# Patient Record
Sex: Male | Born: 1970 | Race: White | Hispanic: No | Marital: Single | Smoking: Former smoker
Health system: Southern US, Community
[De-identification: ages and names within clinical notes are randomized; demographics above are authoritative.]

## PROBLEM LIST (undated history)

## (undated) DIAGNOSIS — I509 Heart failure, unspecified: Secondary | ICD-10-CM

## (undated) DIAGNOSIS — F32A Depression, unspecified: Secondary | ICD-10-CM

## (undated) DIAGNOSIS — E119 Type 2 diabetes mellitus without complications: Secondary | ICD-10-CM

## (undated) DIAGNOSIS — D638 Anemia in other chronic diseases classified elsewhere: Secondary | ICD-10-CM

## (undated) DIAGNOSIS — E669 Obesity, unspecified: Secondary | ICD-10-CM

## (undated) DIAGNOSIS — I10 Essential (primary) hypertension: Secondary | ICD-10-CM

## (undated) DIAGNOSIS — F329 Major depressive disorder, single episode, unspecified: Secondary | ICD-10-CM

## (undated) DIAGNOSIS — E785 Hyperlipidemia, unspecified: Secondary | ICD-10-CM

## (undated) DIAGNOSIS — N189 Chronic kidney disease, unspecified: Secondary | ICD-10-CM

## (undated) HISTORY — PX: FOOT AMPUTATION: SHX951

## (undated) HISTORY — PX: OTHER SURGICAL HISTORY: SHX169

---
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
## 2019-12-06 ENCOUNTER — Emergency Department (HOSPITAL_COMMUNITY): Payer: Medicare PPO

## 2019-12-06 ENCOUNTER — Encounter (HOSPITAL_COMMUNITY): Payer: Self-pay | Admitting: Emergency Medicine

## 2019-12-06 ENCOUNTER — Inpatient Hospital Stay (HOSPITAL_COMMUNITY)
Admission: EM | Admit: 2019-12-06 | Discharge: 2019-12-16 | DRG: 239 | Disposition: A | Discharge status: Home Health | Payer: Medicare PPO | Attending: Internal Medicine | Admitting: Internal Medicine

## 2019-12-06 ENCOUNTER — Other Ambulatory Visit: Payer: Self-pay

## 2019-12-06 DIAGNOSIS — E11622 Type 2 diabetes mellitus with other skin ulcer: Secondary | ICD-10-CM

## 2019-12-06 DIAGNOSIS — R7989 Other specified abnormal findings of blood chemistry: Secondary | ICD-10-CM

## 2019-12-06 DIAGNOSIS — E871 Hypo-osmolality and hyponatremia: Secondary | ICD-10-CM | POA: Diagnosis present

## 2019-12-06 DIAGNOSIS — Z951 Presence of aortocoronary bypass graft: Secondary | ICD-10-CM

## 2019-12-06 DIAGNOSIS — D638 Anemia in other chronic diseases classified elsewhere: Secondary | ICD-10-CM | POA: Diagnosis present

## 2019-12-06 DIAGNOSIS — E785 Hyperlipidemia, unspecified: Secondary | ICD-10-CM | POA: Diagnosis present

## 2019-12-06 DIAGNOSIS — I10 Essential (primary) hypertension: Secondary | ICD-10-CM | POA: Diagnosis present

## 2019-12-06 DIAGNOSIS — R400 Somnolence: Secondary | ICD-10-CM

## 2019-12-06 DIAGNOSIS — N179 Acute kidney failure, unspecified: Secondary | ICD-10-CM | POA: Diagnosis present

## 2019-12-06 DIAGNOSIS — I5033 Acute on chronic diastolic (congestive) heart failure: Secondary | ICD-10-CM | POA: Diagnosis present

## 2019-12-06 DIAGNOSIS — R739 Hyperglycemia, unspecified: Secondary | ICD-10-CM

## 2019-12-06 DIAGNOSIS — Z87891 Personal history of nicotine dependence: Secondary | ICD-10-CM

## 2019-12-06 DIAGNOSIS — Z79899 Other long term (current) drug therapy: Secondary | ICD-10-CM

## 2019-12-06 DIAGNOSIS — Y92410 Unspecified street and highway as the place of occurrence of the external cause: Secondary | ICD-10-CM

## 2019-12-06 DIAGNOSIS — N1832 Chronic kidney disease, stage 3b: Secondary | ICD-10-CM | POA: Diagnosis not present

## 2019-12-06 DIAGNOSIS — Z7902 Long term (current) use of antithrombotics/antiplatelets: Secondary | ICD-10-CM | POA: Diagnosis not present

## 2019-12-06 DIAGNOSIS — L97909 Non-pressure chronic ulcer of unspecified part of unspecified lower leg with unspecified severity: Secondary | ICD-10-CM | POA: Diagnosis not present

## 2019-12-06 DIAGNOSIS — S82891B Other fracture of right lower leg, initial encounter for open fracture type I or II: Secondary | ICD-10-CM

## 2019-12-06 DIAGNOSIS — I509 Heart failure, unspecified: Secondary | ICD-10-CM | POA: Diagnosis not present

## 2019-12-06 DIAGNOSIS — Z20822 Contact with and (suspected) exposure to covid-19: Secondary | ICD-10-CM | POA: Diagnosis present

## 2019-12-06 DIAGNOSIS — D631 Anemia in chronic kidney disease: Secondary | ICD-10-CM | POA: Diagnosis present

## 2019-12-06 DIAGNOSIS — E1122 Type 2 diabetes mellitus with diabetic chronic kidney disease: Secondary | ICD-10-CM | POA: Diagnosis present

## 2019-12-06 DIAGNOSIS — G894 Chronic pain syndrome: Secondary | ICD-10-CM | POA: Diagnosis present

## 2019-12-06 DIAGNOSIS — I2510 Atherosclerotic heart disease of native coronary artery without angina pectoris: Secondary | ICD-10-CM | POA: Diagnosis present

## 2019-12-06 DIAGNOSIS — E869 Volume depletion, unspecified: Secondary | ICD-10-CM | POA: Diagnosis not present

## 2019-12-06 DIAGNOSIS — E1151 Type 2 diabetes mellitus with diabetic peripheral angiopathy without gangrene: Secondary | ICD-10-CM | POA: Diagnosis present

## 2019-12-06 DIAGNOSIS — S82891A Other fracture of right lower leg, initial encounter for closed fracture: Secondary | ICD-10-CM | POA: Diagnosis present

## 2019-12-06 DIAGNOSIS — R748 Abnormal levels of other serum enzymes: Secondary | ICD-10-CM | POA: Diagnosis present

## 2019-12-06 DIAGNOSIS — R410 Disorientation, unspecified: Secondary | ICD-10-CM

## 2019-12-06 DIAGNOSIS — E11621 Type 2 diabetes mellitus with foot ulcer: Secondary | ICD-10-CM | POA: Diagnosis present

## 2019-12-06 DIAGNOSIS — Z89512 Acquired absence of left leg below knee: Secondary | ICD-10-CM

## 2019-12-06 DIAGNOSIS — R791 Abnormal coagulation profile: Secondary | ICD-10-CM | POA: Diagnosis not present

## 2019-12-06 DIAGNOSIS — S9304XA Dislocation of right ankle joint, initial encounter: Secondary | ICD-10-CM | POA: Diagnosis present

## 2019-12-06 DIAGNOSIS — I959 Hypotension, unspecified: Secondary | ICD-10-CM

## 2019-12-06 DIAGNOSIS — L0889 Other specified local infections of the skin and subcutaneous tissue: Secondary | ICD-10-CM | POA: Diagnosis not present

## 2019-12-06 DIAGNOSIS — B952 Enterococcus as the cause of diseases classified elsewhere: Secondary | ICD-10-CM | POA: Diagnosis present

## 2019-12-06 DIAGNOSIS — N184 Chronic kidney disease, stage 4 (severe): Secondary | ICD-10-CM | POA: Diagnosis present

## 2019-12-06 DIAGNOSIS — E1110 Type 2 diabetes mellitus with ketoacidosis without coma: Secondary | ICD-10-CM | POA: Diagnosis present

## 2019-12-06 DIAGNOSIS — E11628 Type 2 diabetes mellitus with other skin complications: Secondary | ICD-10-CM | POA: Diagnosis present

## 2019-12-06 DIAGNOSIS — I998 Other disorder of circulatory system: Secondary | ICD-10-CM | POA: Diagnosis present

## 2019-12-06 DIAGNOSIS — L97519 Non-pressure chronic ulcer of other part of right foot with unspecified severity: Secondary | ICD-10-CM | POA: Diagnosis present

## 2019-12-06 DIAGNOSIS — Z9049 Acquired absence of other specified parts of digestive tract: Secondary | ICD-10-CM

## 2019-12-06 DIAGNOSIS — I130 Hypertensive heart and chronic kidney disease with heart failure and stage 1 through stage 4 chronic kidney disease, or unspecified chronic kidney disease: Secondary | ICD-10-CM | POA: Diagnosis present

## 2019-12-06 DIAGNOSIS — N189 Chronic kidney disease, unspecified: Secondary | ICD-10-CM | POA: Diagnosis present

## 2019-12-06 DIAGNOSIS — K5900 Constipation, unspecified: Secondary | ICD-10-CM | POA: Diagnosis not present

## 2019-12-06 DIAGNOSIS — T40605A Adverse effect of unspecified narcotics, initial encounter: Secondary | ICD-10-CM | POA: Diagnosis present

## 2019-12-06 DIAGNOSIS — I70229 Atherosclerosis of native arteries of extremities with rest pain, unspecified extremity: Secondary | ICD-10-CM

## 2019-12-06 DIAGNOSIS — I70221 Atherosclerosis of native arteries of extremities with rest pain, right leg: Secondary | ICD-10-CM | POA: Diagnosis present

## 2019-12-06 DIAGNOSIS — I5032 Chronic diastolic (congestive) heart failure: Secondary | ICD-10-CM | POA: Diagnosis not present

## 2019-12-06 DIAGNOSIS — E669 Obesity, unspecified: Secondary | ICD-10-CM | POA: Diagnosis present

## 2019-12-06 DIAGNOSIS — T4275XA Adverse effect of unspecified antiepileptic and sedative-hypnotic drugs, initial encounter: Secondary | ICD-10-CM | POA: Diagnosis present

## 2019-12-06 DIAGNOSIS — Z6835 Body mass index (BMI) 35.0-35.9, adult: Secondary | ICD-10-CM | POA: Diagnosis not present

## 2019-12-06 DIAGNOSIS — IMO0002 Reserved for concepts with insufficient information to code with codable children: Secondary | ICD-10-CM

## 2019-12-06 DIAGNOSIS — Z794 Long term (current) use of insulin: Secondary | ICD-10-CM

## 2019-12-06 DIAGNOSIS — L089 Local infection of the skin and subcutaneous tissue, unspecified: Secondary | ICD-10-CM | POA: Diagnosis present

## 2019-12-06 DIAGNOSIS — E1165 Type 2 diabetes mellitus with hyperglycemia: Secondary | ICD-10-CM | POA: Diagnosis not present

## 2019-12-06 DIAGNOSIS — Z23 Encounter for immunization: Secondary | ICD-10-CM

## 2019-12-06 DIAGNOSIS — E1142 Type 2 diabetes mellitus with diabetic polyneuropathy: Secondary | ICD-10-CM | POA: Diagnosis present

## 2019-12-06 DIAGNOSIS — G92 Toxic encephalopathy: Secondary | ICD-10-CM | POA: Diagnosis present

## 2019-12-06 DIAGNOSIS — R0602 Shortness of breath: Secondary | ICD-10-CM

## 2019-12-06 HISTORY — DX: Depression, unspecified: F32.A

## 2019-12-06 HISTORY — DX: Chronic kidney disease, unspecified: N18.9

## 2019-12-06 HISTORY — DX: Obesity, unspecified: E66.9

## 2019-12-06 HISTORY — DX: Type 2 diabetes mellitus without complications: E11.9

## 2019-12-06 HISTORY — DX: Hyperlipidemia, unspecified: E78.5

## 2019-12-06 HISTORY — DX: Heart failure, unspecified: I50.9

## 2019-12-06 HISTORY — DX: Essential (primary) hypertension: I10

## 2019-12-06 HISTORY — DX: Anemia in other chronic diseases classified elsewhere: D63.8

## 2019-12-06 LAB — COMPREHENSIVE METABOLIC PANEL
ALT: 286 U/L — ABNORMAL HIGH (ref 0–44)
AST: 52 U/L — ABNORMAL HIGH (ref 15–41)
Albumin: 2.5 g/dL — ABNORMAL LOW (ref 3.5–5.0)
Alkaline Phosphatase: 122 U/L (ref 38–126)
Anion gap: 18 — ABNORMAL HIGH (ref 5–15)
BUN: 87 mg/dL — ABNORMAL HIGH (ref 6–20)
CO2: 17 mmol/L — ABNORMAL LOW (ref 22–32)
Calcium: 6.5 mg/dL — ABNORMAL LOW (ref 8.9–10.3)
Chloride: 92 mmol/L — ABNORMAL LOW (ref 98–111)
Creatinine, Ser: 5.56 mg/dL — ABNORMAL HIGH (ref 0.61–1.24)
GFR calc Af Amer: 13 mL/min — ABNORMAL LOW (ref >60)
GFR calc non Af Amer: 11 mL/min — ABNORMAL LOW (ref >60)
Glucose, Bld: 605 mg/dL (ref 70–99)
Potassium: 4.5 mmol/L (ref 3.5–5.1)
Sodium: 127 mmol/L — ABNORMAL LOW (ref 135–145)
Total Bilirubin: 2.2 mg/dL — ABNORMAL HIGH (ref 0.3–1.2)
Total Protein: 6.8 g/dL (ref 6.5–8.1)

## 2019-12-06 LAB — BASIC METABOLIC PANEL
Anion gap: 13 (ref 5–15)
BUN: 85 mg/dL — ABNORMAL HIGH (ref 6–20)
CO2: 20 mmol/L — ABNORMAL LOW (ref 22–32)
Calcium: 6.5 mg/dL — ABNORMAL LOW (ref 8.9–10.3)
Chloride: 100 mmol/L (ref 98–111)
Creatinine, Ser: 5.11 mg/dL — ABNORMAL HIGH (ref 0.61–1.24)
GFR calc Af Amer: 14 mL/min — ABNORMAL LOW (ref >60)
GFR calc non Af Amer: 12 mL/min — ABNORMAL LOW (ref >60)
Glucose, Bld: 115 mg/dL — ABNORMAL HIGH (ref 70–99)
Potassium: 3.8 mmol/L (ref 3.5–5.1)
Sodium: 133 mmol/L — ABNORMAL LOW (ref 135–145)

## 2019-12-06 LAB — GLUCOSE, CAPILLARY
Glucose-Capillary: 106 mg/dL — ABNORMAL HIGH (ref 70–99)
Glucose-Capillary: 119 mg/dL — ABNORMAL HIGH (ref 70–99)
Glucose-Capillary: 138 mg/dL — ABNORMAL HIGH (ref 70–99)
Glucose-Capillary: 143 mg/dL — ABNORMAL HIGH (ref 70–99)
Glucose-Capillary: 162 mg/dL — ABNORMAL HIGH (ref 70–99)

## 2019-12-06 LAB — HEPATITIS PANEL, ACUTE
HCV Ab: NONREACTIVE
Hep A IgM: NONREACTIVE
Hep B C IgM: NONREACTIVE
Hepatitis B Surface Ag: NONREACTIVE

## 2019-12-06 LAB — CBC WITH DIFFERENTIAL/PLATELET
Abs Immature Granulocytes: 0.11 10*3/uL — ABNORMAL HIGH (ref 0.00–0.07)
Basophils Absolute: 0 10*3/uL (ref 0.0–0.1)
Basophils Relative: 0 %
Eosinophils Absolute: 0 10*3/uL (ref 0.0–0.5)
Eosinophils Relative: 0 %
HCT: 29 % — ABNORMAL LOW (ref 39.0–52.0)
Hemoglobin: 8.5 g/dL — ABNORMAL LOW (ref 13.0–17.0)
Immature Granulocytes: 1 %
Lymphocytes Relative: 15 %
Lymphs Abs: 1.3 10*3/uL (ref 0.7–4.0)
MCH: 24.6 pg — ABNORMAL LOW (ref 26.0–34.0)
MCHC: 29.3 g/dL — ABNORMAL LOW (ref 30.0–36.0)
MCV: 83.8 fL (ref 80.0–100.0)
Monocytes Absolute: 0.9 10*3/uL (ref 0.1–1.0)
Monocytes Relative: 11 %
Neutro Abs: 5.9 10*3/uL (ref 1.7–7.7)
Neutrophils Relative %: 73 %
Platelets: 225 10*3/uL (ref 150–400)
RBC: 3.46 MIL/uL — ABNORMAL LOW (ref 4.22–5.81)
RDW: 17.1 % — ABNORMAL HIGH (ref 11.5–15.5)
WBC: 8.2 10*3/uL (ref 4.0–10.5)
nRBC: 0 % (ref 0.0–0.2)

## 2019-12-06 LAB — URINALYSIS, ROUTINE W REFLEX MICROSCOPIC
Bilirubin Urine: NEGATIVE
Glucose, UA: 50 mg/dL — AB
Hgb urine dipstick: NEGATIVE
Ketones, ur: NEGATIVE mg/dL
Leukocytes,Ua: NEGATIVE
Nitrite: NEGATIVE
Protein, ur: 30 mg/dL — AB
Specific Gravity, Urine: 1.016 (ref 1.005–1.030)
pH: 5 (ref 5.0–8.0)

## 2019-12-06 LAB — POCT I-STAT EG7
Acid-base deficit: 6 mmol/L — ABNORMAL HIGH (ref 0.0–2.0)
Bicarbonate: 20.9 mmol/L (ref 20.0–28.0)
Calcium, Ion: 0.88 mmol/L — CL (ref 1.15–1.40)
HCT: 28 % — ABNORMAL LOW (ref 39.0–52.0)
Hemoglobin: 9.5 g/dL — ABNORMAL LOW (ref 13.0–17.0)
O2 Saturation: 79 %
Potassium: 4.4 mmol/L (ref 3.5–5.1)
Sodium: 131 mmol/L — ABNORMAL LOW (ref 135–145)
TCO2: 22 mmol/L (ref 22–32)
pCO2, Ven: 45.8 mmHg (ref 44.0–60.0)
pH, Ven: 7.268 (ref 7.250–7.430)
pO2, Ven: 50 mmHg — ABNORMAL HIGH (ref 32.0–45.0)

## 2019-12-06 LAB — CBG MONITORING, ED
Glucose-Capillary: 482 mg/dL — ABNORMAL HIGH (ref 70–99)
Glucose-Capillary: 498 mg/dL — ABNORMAL HIGH (ref 70–99)
Glucose-Capillary: 347 mg/dL — ABNORMAL HIGH (ref 70–99)
Glucose-Capillary: 461 mg/dL — ABNORMAL HIGH (ref 70–99)
Glucose-Capillary: 283 mg/dL — ABNORMAL HIGH (ref 70–99)
Glucose-Capillary: 219 mg/dL — ABNORMAL HIGH (ref 70–99)

## 2019-12-06 LAB — ETHANOL: Alcohol, Ethyl (B): <10 mg/dL (ref <10)

## 2019-12-06 LAB — RESPIRATORY PANEL BY RT PCR (FLU A&B, COVID)
Influenza A by PCR: NEGATIVE
Influenza B by PCR: NEGATIVE
SARS Coronavirus 2 by RT PCR: NEGATIVE

## 2019-12-06 LAB — APTT: aPTT: 36 seconds (ref 24–36)

## 2019-12-06 LAB — AMMONIA: Ammonia: 27 umol/L (ref 9–35)

## 2019-12-06 LAB — LACTIC ACID, PLASMA: Lactic Acid, Venous: 1.9 mmol/L (ref 0.5–1.9)

## 2019-12-06 LAB — IRON AND TIBC
Iron: 15 ug/dL — ABNORMAL LOW (ref 45–182)
Saturation Ratios: 8 % — ABNORMAL LOW (ref 17.9–39.5)
TIBC: 185 ug/dL — ABNORMAL LOW (ref 250–450)
UIBC: 170 ug/dL

## 2019-12-06 LAB — VITAMIN B12: Vitamin B-12: 2350 pg/mL — ABNORMAL HIGH (ref 180–914)

## 2019-12-06 LAB — PROTIME-INR
INR: 1.5 — ABNORMAL HIGH (ref 0.8–1.2)
Prothrombin Time: 18.4 seconds — ABNORMAL HIGH (ref 11.4–15.2)

## 2019-12-06 LAB — MAGNESIUM: Magnesium: 1.1 mg/dL — ABNORMAL LOW (ref 1.7–2.4)

## 2019-12-06 LAB — HEMOGLOBIN A1C
Hgb A1c MFr Bld: 7.8 % — ABNORMAL HIGH (ref 4.8–5.6)
Mean Plasma Glucose: 177.16 mg/dL

## 2019-12-06 LAB — LIPID PANEL
Cholesterol: 80 mg/dL (ref 0–200)
HDL: <10 mg/dL — ABNORMAL LOW (ref >40)
Triglycerides: 213 mg/dL — ABNORMAL HIGH (ref <150)
VLDL: 43 mg/dL — ABNORMAL HIGH (ref 0–40)

## 2019-12-06 LAB — RAPID URINE DRUG SCREEN, HOSP PERFORMED
Amphetamines: POSITIVE — AB
Barbiturates: NOT DETECTED
Benzodiazepines: NOT DETECTED
Cocaine: NOT DETECTED
Opiates: POSITIVE — AB
Tetrahydrocannabinol: NOT DETECTED

## 2019-12-06 LAB — FERRITIN: Ferritin: 3752 ng/mL — ABNORMAL HIGH (ref 24–336)

## 2019-12-06 LAB — PHOSPHORUS: Phosphorus: 5.3 mg/dL — ABNORMAL HIGH (ref 2.5–4.6)

## 2019-12-06 LAB — HIV ANTIBODY (ROUTINE TESTING W REFLEX): HIV Screen 4th Generation wRfx: NONREACTIVE

## 2019-12-06 MED ORDER — MAGNESIUM SULFATE 2 GM/50ML IV SOLN
2.0000 g | Freq: Once | INTRAVENOUS | Status: AC
  Administered 2019-12-06: 2 g via INTRAVENOUS
  Filled 2019-12-06: qty 50

## 2019-12-06 MED ORDER — TETANUS-DIPHTH-ACELL PERTUSSIS 5-2.5-18.5 LF-MCG/0.5 IM SUSP
0.5000 mL | Freq: Once | INTRAMUSCULAR | Status: AC
  Administered 2019-12-06: 0.5 mL via INTRAMUSCULAR
  Filled 2019-12-06: qty 0.5

## 2019-12-06 MED ORDER — ONDANSETRON HCL 4 MG/2ML IJ SOLN: 4.0000 mg | Freq: Four times a day (QID) | INTRAMUSCULAR | Status: DC | PRN

## 2019-12-06 MED ORDER — CEFAZOLIN SODIUM-DEXTROSE 2-4 GM/100ML-% IV SOLN
2.0000 g | Freq: Once | INTRAVENOUS | Status: AC
  Administered 2019-12-06: 2 g via INTRAVENOUS
  Filled 2019-12-06: qty 100

## 2019-12-06 MED ORDER — HEPARIN SODIUM (PORCINE) 5000 UNIT/ML IJ SOLN: 5000.0000 [IU] | Freq: Three times a day (TID) | INTRAMUSCULAR | Status: DC

## 2019-12-06 MED ORDER — METOPROLOL SUCCINATE ER 25 MG PO TB24
25.0000 mg | ORAL_TABLET | Freq: Two times a day (BID) | ORAL | Status: DC
  Administered 2019-12-06 – 2019-12-16 (×18): 25 mg via ORAL
  Filled 2019-12-06 (×19): qty 1

## 2019-12-06 MED ORDER — ISOSORBIDE MONONITRATE ER 30 MG PO TB24
30.0000 mg | ORAL_TABLET | Freq: Every day | ORAL | Status: DC
  Administered 2019-12-07 – 2019-12-16 (×9): 30 mg via ORAL
  Filled 2019-12-06 (×9): qty 1

## 2019-12-06 MED ORDER — INSULIN ASPART 100 UNIT/ML ~~LOC~~ SOLN: 6.0000 [IU] | Freq: Once | SUBCUTANEOUS | Status: DC

## 2019-12-06 MED ORDER — INSULIN ASPART 100 UNIT/ML ~~LOC~~ SOLN: 0.0000 [IU] | Freq: Three times a day (TID) | SUBCUTANEOUS | Status: DC

## 2019-12-06 MED ORDER — INSULIN REGULAR(HUMAN) IN NACL 100-0.9 UT/100ML-% IV SOLN
INTRAVENOUS | Status: DC
  Administered 2019-12-06: 14:00:00 11.5 [IU]/h via INTRAVENOUS
  Filled 2019-12-06: qty 100

## 2019-12-06 MED ORDER — TETANUS-DIPHTHERIA TOXOIDS TD 5-2 LFU IM INJ: 0.5000 mL | INJECTION | Freq: Once | INTRAMUSCULAR | Status: DC

## 2019-12-06 MED ORDER — INSULIN ASPART 100 UNIT/ML ~~LOC~~ SOLN: 0.0000 [IU] | Freq: Every day | SUBCUTANEOUS | Status: DC

## 2019-12-06 MED ORDER — AMPHETAMINE-DEXTROAMPHETAMINE 10 MG PO TABS
30.0000 mg | ORAL_TABLET | Freq: Two times a day (BID) | ORAL | Status: DC
  Administered 2019-12-07: 30 mg via ORAL
  Filled 2019-12-06: qty 3

## 2019-12-06 MED ORDER — SODIUM CHLORIDE 0.9 % IV BOLUS
1000.0000 mL | Freq: Once | INTRAVENOUS | Status: AC
  Administered 2019-12-06: 1000 mL via INTRAVENOUS

## 2019-12-06 MED ORDER — FENTANYL CITRATE (PF) 100 MCG/2ML IJ SOLN
12.5000 ug | INTRAMUSCULAR | Status: DC | PRN
  Administered 2019-12-06: 12.5 ug via INTRAVENOUS
  Filled 2019-12-06: qty 2

## 2019-12-06 MED ORDER — SODIUM CHLORIDE 0.9 % IV SOLN: INTRAVENOUS | Status: DC

## 2019-12-06 MED ORDER — QUETIAPINE FUMARATE ER 50 MG PO TB24
150.0000 mg | ORAL_TABLET | Freq: Every day | ORAL | Status: DC
  Administered 2019-12-06: 22:00:00 150 mg via ORAL
  Filled 2019-12-06 (×3): qty 3

## 2019-12-06 MED ORDER — ONDANSETRON HCL 4 MG PO TABS: 4.0000 mg | ORAL_TABLET | Freq: Four times a day (QID) | ORAL | Status: DC | PRN

## 2019-12-06 MED ORDER — AMLODIPINE BESYLATE 10 MG PO TABS
10.0000 mg | ORAL_TABLET | Freq: Every day | ORAL | Status: DC
  Administered 2019-12-07: 10 mg via ORAL
  Filled 2019-12-06: qty 1

## 2019-12-06 MED ORDER — ACETAMINOPHEN 325 MG PO TABS
650.0000 mg | ORAL_TABLET | Freq: Four times a day (QID) | ORAL | Status: DC | PRN
  Administered 2019-12-07 – 2019-12-08 (×2): 650 mg via ORAL
  Filled 2019-12-06 (×3): qty 2

## 2019-12-06 MED ORDER — ATORVASTATIN CALCIUM 10 MG PO TABS
10.0000 mg | ORAL_TABLET | Freq: Every day | ORAL | Status: DC
  Administered 2019-12-06: 10 mg via ORAL
  Filled 2019-12-06: qty 1

## 2019-12-06 MED ORDER — PREGABALIN 100 MG PO CAPS: 200.0000 mg | ORAL_CAPSULE | Freq: Two times a day (BID) | ORAL | Status: DC

## 2019-12-06 MED ORDER — DEXTROSE-NACL 5-0.45 % IV SOLN: INTRAVENOUS | Status: DC

## 2019-12-06 MED ORDER — INSULIN GLARGINE 100 UNIT/ML ~~LOC~~ SOLN
10.0000 [IU] | Freq: Every day | SUBCUTANEOUS | Status: DC
  Administered 2019-12-07 – 2019-12-09 (×4): 10 [IU] via SUBCUTANEOUS
  Filled 2019-12-06 (×6): qty 0.1

## 2019-12-06 MED ORDER — ACETAMINOPHEN 650 MG RE SUPP: 650.0000 mg | Freq: Four times a day (QID) | RECTAL | Status: DC | PRN

## 2019-12-06 MED ORDER — DEXTROSE 50 % IV SOLN: 0.0000 mL | INTRAVENOUS | Status: DC | PRN

## 2019-12-06 MED ORDER — CLOPIDOGREL BISULFATE 75 MG PO TABS
75.0000 mg | ORAL_TABLET | Freq: Every day | ORAL | Status: DC
  Administered 2019-12-07 – 2019-12-16 (×9): 75 mg via ORAL
  Filled 2019-12-06 (×9): qty 1
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
## 2019-12-06 NOTE — ED Notes (Signed)
Patient transported to CT 
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
## 2019-12-06 NOTE — Progress Notes (Signed)
Patient received from ED. VSS stable. CHG bath complete. On monitor CCMD notified. Alert and oriented to room and call light.  Paulene Floor, RN

## 2019-12-07 ENCOUNTER — Other Ambulatory Visit (HOSPITAL_COMMUNITY): Payer: Medicare PPO

## 2019-12-07 ENCOUNTER — Inpatient Hospital Stay (HOSPITAL_COMMUNITY): Payer: Medicare PPO

## 2019-12-07 DIAGNOSIS — E785 Hyperlipidemia, unspecified: Secondary | ICD-10-CM

## 2019-12-07 DIAGNOSIS — E1110 Type 2 diabetes mellitus with ketoacidosis without coma: Secondary | ICD-10-CM

## 2019-12-07 DIAGNOSIS — R748 Abnormal levels of other serum enzymes: Secondary | ICD-10-CM

## 2019-12-07 DIAGNOSIS — N179 Acute kidney failure, unspecified: Secondary | ICD-10-CM

## 2019-12-07 DIAGNOSIS — R7989 Other specified abnormal findings of blood chemistry: Secondary | ICD-10-CM

## 2019-12-07 DIAGNOSIS — R400 Somnolence: Secondary | ICD-10-CM

## 2019-12-07 DIAGNOSIS — I959 Hypotension, unspecified: Secondary | ICD-10-CM

## 2019-12-07 DIAGNOSIS — R791 Abnormal coagulation profile: Secondary | ICD-10-CM | POA: Diagnosis present

## 2019-12-07 DIAGNOSIS — S82891B Other fracture of right lower leg, initial encounter for open fracture type I or II: Secondary | ICD-10-CM

## 2019-12-07 DIAGNOSIS — D638 Anemia in other chronic diseases classified elsewhere: Secondary | ICD-10-CM

## 2019-12-07 DIAGNOSIS — R410 Disorientation, unspecified: Secondary | ICD-10-CM

## 2019-12-07 DIAGNOSIS — I2510 Atherosclerotic heart disease of native coronary artery without angina pectoris: Secondary | ICD-10-CM

## 2019-12-07 DIAGNOSIS — I10 Essential (primary) hypertension: Secondary | ICD-10-CM

## 2019-12-07 DIAGNOSIS — L089 Local infection of the skin and subcutaneous tissue, unspecified: Secondary | ICD-10-CM

## 2019-12-07 DIAGNOSIS — Z6835 Body mass index (BMI) 35.0-35.9, adult: Secondary | ICD-10-CM

## 2019-12-07 DIAGNOSIS — N1832 Chronic kidney disease, stage 3b: Secondary | ICD-10-CM

## 2019-12-07 LAB — COMPREHENSIVE METABOLIC PANEL
ALT: 146 U/L — ABNORMAL HIGH (ref 0–44)
ALT: 81 U/L — ABNORMAL HIGH (ref 0–44)
AST: 34 U/L (ref 15–41)
AST: 29 U/L (ref 15–41)
Albumin: 2.1 g/dL — ABNORMAL LOW (ref 3.5–5.0)
Albumin: 2 g/dL — ABNORMAL LOW (ref 3.5–5.0)
Alkaline Phosphatase: 92 U/L (ref 38–126)
Alkaline Phosphatase: 91 U/L (ref 38–126)
Anion gap: 17 — ABNORMAL HIGH (ref 5–15)
Anion gap: 15 (ref 5–15)
BUN: 87 mg/dL — ABNORMAL HIGH (ref 6–20)
BUN: 91 mg/dL — ABNORMAL HIGH (ref 6–20)
CO2: 16 mmol/L — ABNORMAL LOW (ref 22–32)
CO2: 17 mmol/L — ABNORMAL LOW (ref 22–32)
Calcium: 6.3 mg/dL — CL (ref 8.9–10.3)
Calcium: 7.1 mg/dL — ABNORMAL LOW (ref 8.9–10.3)
Chloride: 101 mmol/L (ref 98–111)
Chloride: 103 mmol/L (ref 98–111)
Creatinine, Ser: 5.36 mg/dL — ABNORMAL HIGH (ref 0.61–1.24)
Creatinine, Ser: 5.35 mg/dL — ABNORMAL HIGH (ref 0.61–1.24)
GFR calc Af Amer: 13 mL/min — ABNORMAL LOW (ref >60)
GFR calc Af Amer: 13 mL/min — ABNORMAL LOW (ref >60)
GFR calc non Af Amer: 11 mL/min — ABNORMAL LOW (ref >60)
GFR calc non Af Amer: 12 mL/min — ABNORMAL LOW (ref >60)
Glucose, Bld: 139 mg/dL — ABNORMAL HIGH (ref 70–99)
Glucose, Bld: 151 mg/dL — ABNORMAL HIGH (ref 70–99)
Potassium: 4.2 mmol/L (ref 3.5–5.1)
Potassium: 4 mmol/L (ref 3.5–5.1)
Sodium: 134 mmol/L — ABNORMAL LOW (ref 135–145)
Sodium: 135 mmol/L (ref 135–145)
Total Bilirubin: 1.4 mg/dL — ABNORMAL HIGH (ref 0.3–1.2)
Total Bilirubin: 1.4 mg/dL — ABNORMAL HIGH (ref 0.3–1.2)
Total Protein: 6 g/dL — ABNORMAL LOW (ref 6.5–8.1)
Total Protein: 5.8 g/dL — ABNORMAL LOW (ref 6.5–8.1)

## 2019-12-07 LAB — CBC
HCT: 25.6 % — ABNORMAL LOW (ref 39.0–52.0)
Hemoglobin: 7.6 g/dL — ABNORMAL LOW (ref 13.0–17.0)
MCH: 25.2 pg — ABNORMAL LOW (ref 26.0–34.0)
MCHC: 29.7 g/dL — ABNORMAL LOW (ref 30.0–36.0)
MCV: 85 fL (ref 80.0–100.0)
Platelets: 156 10*3/uL (ref 150–400)
RBC: 3.01 MIL/uL — ABNORMAL LOW (ref 4.22–5.81)
RDW: 17.1 % — ABNORMAL HIGH (ref 11.5–15.5)
WBC: 5.7 10*3/uL (ref 4.0–10.5)
nRBC: 0 % (ref 0.0–0.2)

## 2019-12-07 LAB — MAGNESIUM
Magnesium: 1.2 mg/dL — ABNORMAL LOW (ref 1.7–2.4)
Magnesium: 2.3 mg/dL (ref 1.7–2.4)

## 2019-12-07 LAB — GLUCOSE, CAPILLARY
Glucose-Capillary: 134 mg/dL — ABNORMAL HIGH (ref 70–99)
Glucose-Capillary: 140 mg/dL — ABNORMAL HIGH (ref 70–99)
Glucose-Capillary: 141 mg/dL — ABNORMAL HIGH (ref 70–99)
Glucose-Capillary: 148 mg/dL — ABNORMAL HIGH (ref 70–99)
Glucose-Capillary: 156 mg/dL — ABNORMAL HIGH (ref 70–99)
Glucose-Capillary: 191 mg/dL — ABNORMAL HIGH (ref 70–99)

## 2019-12-07 LAB — SURGICAL PCR SCREEN
MRSA, PCR: NEGATIVE
Staphylococcus aureus: NEGATIVE

## 2019-12-07 MED ORDER — FUROSEMIDE 10 MG/ML IJ SOLN
40.0000 mg | Freq: Once | INTRAMUSCULAR | Status: AC
  Administered 2019-12-07: 40 mg via INTRAVENOUS
  Filled 2019-12-07: qty 4

## 2019-12-07 MED ORDER — MAGNESIUM SULFATE 4 GM/100ML IV SOLN
4.0000 g | Freq: Once | INTRAVENOUS | Status: AC
  Administered 2019-12-07: 4 g via INTRAVENOUS
  Filled 2019-12-07: qty 100

## 2019-12-07 MED ORDER — DARBEPOETIN ALFA 150 MCG/0.3ML IJ SOSY
150.0000 ug | PREFILLED_SYRINGE | INTRAMUSCULAR | Status: DC
  Administered 2019-12-07: 150 ug via SUBCUTANEOUS
  Filled 2019-12-07 (×3): qty 0.3

## 2019-12-07 MED ORDER — INSULIN ASPART 100 UNIT/ML ~~LOC~~ SOLN
0.0000 [IU] | SUBCUTANEOUS | Status: DC
  Administered 2019-12-07: 1 [IU] via SUBCUTANEOUS
  Administered 2019-12-08: 2 [IU] via SUBCUTANEOUS
  Administered 2019-12-08: 5 [IU] via SUBCUTANEOUS
  Administered 2019-12-08: 3 [IU] via SUBCUTANEOUS
  Administered 2019-12-08: 2 [IU] via SUBCUTANEOUS
  Administered 2019-12-09 (×3): 5 [IU] via SUBCUTANEOUS
  Administered 2019-12-09: 3 [IU] via SUBCUTANEOUS
  Administered 2019-12-10: 5 [IU] via SUBCUTANEOUS
  Administered 2019-12-10: 7 [IU] via SUBCUTANEOUS
  Administered 2019-12-10: 5 [IU] via SUBCUTANEOUS
  Administered 2019-12-10: 3 [IU] via SUBCUTANEOUS

## 2019-12-07 MED ORDER — OXYCODONE HCL 5 MG PO TABS
5.0000 mg | ORAL_TABLET | Freq: Once | ORAL | Status: AC
  Administered 2019-12-07: 5 mg via ORAL
  Filled 2019-12-07: qty 1

## 2019-12-07 MED ORDER — CALCIUM GLUCONATE-NACL 2-0.675 GM/100ML-% IV SOLN
2.0000 g | Freq: Once | INTRAVENOUS | Status: AC
  Administered 2019-12-07: 2000 mg via INTRAVENOUS
  Filled 2019-12-07: qty 100

## 2019-12-07 NOTE — Progress Notes (Addendum)
Attempted to get patient for ABI. Patient is refusing test at this time. Please contact vascular lab if patient becomes agreeable to test.  June Leap, BS, RDMS, RVT
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
## 2019-12-07 NOTE — Progress Notes (Signed)
CRITICAL VALUE ALERT  Critical Value:  Calcium 6.3  Date & Time Notied:  12/07/19 at 03:36 am  Provider Notified: Forrest Moron, NP  Orders Received/Actions taken: One time IV magnesium sulfate 4 Grams

## 2019-12-07 NOTE — Progress Notes (Signed)
Called [place] to confirm ab [place]. On call person assisting and may take a couple more hours but they are aware. Unable to contact vascular lab. Will continue to work with hospital operator. Patient initially refused ABI but is now wanting as soon as possible. Pt resting with call bell within reach.  Will continue to monitor.

## 2019-12-08 ENCOUNTER — Inpatient Hospital Stay (HOSPITAL_COMMUNITY): Payer: Medicare PPO

## 2019-12-08 DIAGNOSIS — L0889 Other specified local infections of the skin and subcutaneous tissue: Secondary | ICD-10-CM

## 2019-12-08 DIAGNOSIS — I509 Heart failure, unspecified: Secondary | ICD-10-CM

## 2019-12-08 DIAGNOSIS — N179 Acute kidney failure, unspecified: Secondary | ICD-10-CM

## 2019-12-08 DIAGNOSIS — R739 Hyperglycemia, unspecified: Secondary | ICD-10-CM

## 2019-12-08 DIAGNOSIS — I5032 Chronic diastolic (congestive) heart failure: Secondary | ICD-10-CM

## 2019-12-08 DIAGNOSIS — S82891A Other fracture of right lower leg, initial encounter for closed fracture: Secondary | ICD-10-CM

## 2019-12-08 LAB — RENAL FUNCTION PANEL
Albumin: 1.9 g/dL — ABNORMAL LOW (ref 3.5–5.0)
Anion gap: 16 — ABNORMAL HIGH (ref 5–15)
BUN: 91 mg/dL — ABNORMAL HIGH (ref 6–20)
CO2: 17 mmol/L — ABNORMAL LOW (ref 22–32)
Calcium: 7 mg/dL — ABNORMAL LOW (ref 8.9–10.3)
Chloride: 105 mmol/L (ref 98–111)
Creatinine, Ser: 5.08 mg/dL — ABNORMAL HIGH (ref 0.61–1.24)
GFR calc Af Amer: 14 mL/min — ABNORMAL LOW (ref >60)
GFR calc non Af Amer: 12 mL/min — ABNORMAL LOW (ref >60)
Glucose, Bld: 152 mg/dL — ABNORMAL HIGH (ref 70–99)
Phosphorus: 4.7 mg/dL — ABNORMAL HIGH (ref 2.5–4.6)
Potassium: 4.3 mmol/L (ref 3.5–5.1)
Sodium: 138 mmol/L (ref 135–145)

## 2019-12-08 LAB — BASIC METABOLIC PANEL
Anion gap: 12 (ref 5–15)
BUN: 93 mg/dL — ABNORMAL HIGH (ref 6–20)
CO2: 18 mmol/L — ABNORMAL LOW (ref 22–32)
Calcium: 7.8 mg/dL — ABNORMAL LOW (ref 8.9–10.3)
Chloride: 104 mmol/L (ref 98–111)
Creatinine, Ser: 4.92 mg/dL — ABNORMAL HIGH (ref 0.61–1.24)
GFR calc Af Amer: 15 mL/min — ABNORMAL LOW (ref >60)
GFR calc non Af Amer: 13 mL/min — ABNORMAL LOW (ref >60)
Glucose, Bld: 279 mg/dL — ABNORMAL HIGH (ref 70–99)
Potassium: 4.3 mmol/L (ref 3.5–5.1)
Sodium: 134 mmol/L — ABNORMAL LOW (ref 135–145)

## 2019-12-08 LAB — CBC WITH DIFFERENTIAL/PLATELET
Abs Immature Granulocytes: 0.1 10*3/uL — ABNORMAL HIGH (ref 0.00–0.07)
Basophils Absolute: 0 10*3/uL (ref 0.0–0.1)
Basophils Relative: 0 %
Eosinophils Absolute: 0 10*3/uL (ref 0.0–0.5)
Eosinophils Relative: 0 %
HCT: 25.1 % — ABNORMAL LOW (ref 39.0–52.0)
Hemoglobin: 7.6 g/dL — ABNORMAL LOW (ref 13.0–17.0)
Immature Granulocytes: 1 %
Lymphocytes Relative: 10 %
Lymphs Abs: 1.1 10*3/uL (ref 0.7–4.0)
MCH: 24.9 pg — ABNORMAL LOW (ref 26.0–34.0)
MCHC: 30.3 g/dL (ref 30.0–36.0)
MCV: 82.3 fL (ref 80.0–100.0)
Monocytes Absolute: 0.7 10*3/uL (ref 0.1–1.0)
Monocytes Relative: 7 %
Neutro Abs: 8.3 10*3/uL — ABNORMAL HIGH (ref 1.7–7.7)
Neutrophils Relative %: 82 %
Platelets: 192 10*3/uL (ref 150–400)
RBC: 3.05 MIL/uL — ABNORMAL LOW (ref 4.22–5.81)
RDW: 17.4 % — ABNORMAL HIGH (ref 11.5–15.5)
WBC: 10.2 10*3/uL (ref 4.0–10.5)
nRBC: 0 % (ref 0.0–0.2)

## 2019-12-08 LAB — PTH, INTACT AND CALCIUM
Calcium, Total (PTH): 6.8 mg/dL (ref 8.7–10.2)
PTH: 62 pg/mL (ref 15–65)

## 2019-12-08 LAB — FERRITIN: Ferritin: 1180 ng/mL — ABNORMAL HIGH (ref 24–336)

## 2019-12-08 LAB — PREALBUMIN: Prealbumin: 5.4 mg/dL — ABNORMAL LOW (ref 18–38)

## 2019-12-08 LAB — PROTIME-INR
INR: 1.4 — ABNORMAL HIGH (ref 0.8–1.2)
Prothrombin Time: 17.1 seconds — ABNORMAL HIGH (ref 11.4–15.2)

## 2019-12-08 LAB — IRON AND TIBC
Iron: 8 ug/dL — ABNORMAL LOW (ref 45–182)
Saturation Ratios: 5 % — ABNORMAL LOW (ref 17.9–39.5)
TIBC: 151 ug/dL — ABNORMAL LOW (ref 250–450)
UIBC: 143 ug/dL

## 2019-12-08 LAB — GLUCOSE, CAPILLARY
Glucose-Capillary: 151 mg/dL — ABNORMAL HIGH (ref 70–99)
Glucose-Capillary: 163 mg/dL — ABNORMAL HIGH (ref 70–99)
Glucose-Capillary: 173 mg/dL — ABNORMAL HIGH (ref 70–99)
Glucose-Capillary: 182 mg/dL — ABNORMAL HIGH (ref 70–99)
Glucose-Capillary: 222 mg/dL — ABNORMAL HIGH (ref 70–99)
Glucose-Capillary: 255 mg/dL — ABNORMAL HIGH (ref 70–99)

## 2019-12-08 LAB — C-REACTIVE PROTEIN: CRP: 17 mg/dL — ABNORMAL HIGH (ref <1.0)

## 2019-12-08 LAB — CBC
HCT: 26.5 % — ABNORMAL LOW (ref 39.0–52.0)
Hemoglobin: 8 g/dL — ABNORMAL LOW (ref 13.0–17.0)
MCH: 24.8 pg — ABNORMAL LOW (ref 26.0–34.0)
MCHC: 30.2 g/dL (ref 30.0–36.0)
MCV: 82 fL (ref 80.0–100.0)
Platelets: 170 10*3/uL (ref 150–400)
RBC: 3.23 MIL/uL — ABNORMAL LOW (ref 4.22–5.81)
RDW: 17.2 % — ABNORMAL HIGH (ref 11.5–15.5)
WBC: 6.8 10*3/uL (ref 4.0–10.5)
nRBC: 0 % (ref 0.0–0.2)

## 2019-12-08 LAB — SEDIMENTATION RATE: Sed Rate: 100 mm/hr — ABNORMAL HIGH (ref 0–16)

## 2019-12-08 MED ORDER — SODIUM CHLORIDE 0.9 % IV SOLN
2.0000 g | INTRAVENOUS | Status: DC
  Administered 2019-12-08 – 2019-12-10 (×3): 2 g via INTRAVENOUS
  Filled 2019-12-08 (×3): qty 2

## 2019-12-08 MED ORDER — METRONIDAZOLE IN NACL 5-0.79 MG/ML-% IV SOLN
500.0000 mg | Freq: Three times a day (TID) | INTRAVENOUS | Status: DC
  Administered 2019-12-08 – 2019-12-10 (×7): 500 mg via INTRAVENOUS
  Filled 2019-12-08 (×6): qty 100

## 2019-12-08 MED ORDER — PROSIGHT PO TABS
1.0000 | ORAL_TABLET | Freq: Every day | ORAL | Status: DC
  Administered 2019-12-10 – 2019-12-12 (×3): 1 via ORAL
  Filled 2019-12-08 (×3): qty 1

## 2019-12-08 MED ORDER — SODIUM CHLORIDE 0.9 % IV SOLN
3.0000 g | Freq: Once | INTRAVENOUS | Status: AC
  Administered 2019-12-08: 10:00:00 3 g via INTRAVENOUS
  Filled 2019-12-08: qty 30

## 2019-12-08 MED ORDER — ENSURE ENLIVE PO LIQD
237.0000 mL | Freq: Two times a day (BID) | ORAL | Status: DC
  Administered 2019-12-09 – 2019-12-16 (×10): 237 mL via ORAL

## 2019-12-08 MED ORDER — FUROSEMIDE 40 MG PO TABS
40.0000 mg | ORAL_TABLET | Freq: Two times a day (BID) | ORAL | Status: DC
  Administered 2019-12-08 – 2019-12-14 (×11): 40 mg via ORAL
  Filled 2019-12-08 (×9): qty 1

## 2019-12-08 MED ORDER — SODIUM CHLORIDE 0.9 % IV SOLN
510.0000 mg | Freq: Once | INTRAVENOUS | Status: AC
  Administered 2019-12-08: 18:00:00 510 mg via INTRAVENOUS
  Filled 2019-12-08: qty 17

## 2019-12-08 MED ORDER — JUVEN PO PACK
1.0000 | PACK | Freq: Two times a day (BID) | ORAL | Status: DC
  Administered 2019-12-09 – 2019-12-16 (×15): 1 via ORAL
  Filled 2019-12-08 (×17): qty 1

## 2019-12-08 MED ORDER — CEFAZOLIN SODIUM-DEXTROSE 1-4 GM/50ML-% IV SOLN: 1.0000 g | INTRAVENOUS | Status: DC

## 2019-12-08 MED ORDER — OCUVITE-LUTEIN PO CAPS
1.0000 | ORAL_CAPSULE | Freq: Every day | ORAL | Status: DC
  Filled 2019-12-08: qty 1

## 2019-12-08 NOTE — Progress Notes (Signed)
Parathyroid level Calcium Total (PTH) circuital 6.8, Text paged M.D.

## 2019-12-08 NOTE — Progress Notes (Signed)
Patient refuses to wear oxygen.
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
## 2019-12-08 NOTE — Progress Notes (Signed)
Inpatient Diabetes Program Recommendations  AACE/ADA: New Consensus Statement on Inpatient Glycemic Control   Target Ranges:  Prepandial:   less than 140 mg/dL      Peak postprandial:   less than 180 mg/dL (1-2 hours)      Critically ill patients:  140 - 180 mg/dL   Results for Ernest [name], Ernest [name] (MRN [id number]) as of 12/08/2019 16:47  Ref. Range 12/08/2019 00:10 12/08/2019 04:21 12/08/2019 08:11 12/08/2019 11:13 12/08/2019 16:29  Glucose-Capillary Latest Ref Range: 70 - 99 mg/dL 173 (H) 151 (H) 163 (H) 182 (H) 255 (H)  Results for Ernest [name], Ernest [name] (MRN [id number]) as of 12/08/2019 17:05  Ref. Range 12/06/2019 13:58  Hemoglobin A1C Latest Ref Range: 4.8 - 5.6 % 7.8 (H)   Review of Glycemic Control  Diabetes history: DM2 Outpatient Diabetes medications: Novolog 30 units TID with meals, Tradjenat 5 mg dailly, Lantus 40 units QHS (Lantus not on home med list but noted on PCP note on 11/13/19) Current orders for Inpatient glycemic control: Lantus 10 units QHS, Novolog 0-9 units Q4H  Inpatient Diabetes Program Recommendations:   Insulin-Correction: In reviewing chart, noted patient has NOT received any Novolog today (not charted against at all on [facility]).   NOTE: Noted consult for Diabetes Coordinator. Diabetes Coordinator is not on campus over the weekend but available by pager from 8am to 5pm for questions or concerns. Chart reviewed. Noted patient has DM2 hx and per home medication list patient takes Novolog and [place] as an outpatient. Noted in [place], patient seen PCP on 11/13/19 and per outpatient DM medications patient is also prescribed Lantus 40 units QHS (in addition to Novolog and Tradjenta) and A1C was 7.3% at that time). Patient received Lantus last night and fasting glucose 163 mg/dl today. However, Novolog insulin has not been given today per [facility].  RNs please administer insulin as ordered and document on MAR. Sent secure chat to Jeneen Rinks, RN to inquire about insulin not being given  today. Agree with current orders and would not recommend any changes with inpatient DM orders at this time.  Thanks, Barnie Alderman, RN, MSN, CDE Diabetes Coordinator Inpatient Diabetes Program [phone] (Team Pager from 8am to 5pm)

## 2019-12-08 NOTE — Progress Notes (Signed)
Patient told me he had Percocet in his coat and wanted me to hand it to him. I told patient I could not do that and I would call and get him something stronger for pain. I also informed him of Hospital policy about not having meds in his room and they needed to be sent down to Pharm. He told I could not go through his belongings and get it.

## 2019-12-08 NOTE — Progress Notes (Addendum)
RN paged NP due to pt being newly confused tonight, T 102, and RR 29-32. NP to bedside.  S: Says he is SOB "a little bit". Denies pain anywhere. Per RN, pt stated he had to void. Bladder scan 142.  O: Chronically ill appearing male in NAD. T 102. HR 115. BP 130s. RR 24-28. O2 sat 98%. He is alert. Oriented only to year. States he is in his mother's backyard. He is cooperative. Card: S1S2 with tachycardia. Resp: Lungs CTA. He is breathing slightly fast, but is oxygenating well. No real use of accessory muscles, and his abdomen is obese. Abd: soft, generalized tenderness but not specific. MOE x 4. RLE stump. LLE dressed due to infection. Neuro: non focal. PERRL. Strength to face and UEs is 5/5. Tongue is midline. No facial droop. No pronator drift. Skin is warm and dry.  A/P: 1. Confusion new in past hour or so, but not present earlier. Review of chart shows he was oriented today on rounds and disoriented on admission. CT head on admission showed age advanced cortical atrophy. Perhaps, he may have underlying dementia and he sundowns at night? D/c Seroquel for tonight (holding sedation). Labs show CBG 222, ammonia-43; CMP, CBC with diff-unremarkable. LA normal  This could all be secondary to temperature.  2. Tachypnea-believe secondary to fever. He is not really working hard to breathe. NP wonders if related to fever and/or abdominal girth. Satting fine. Was going to place 2L for comfort, but pt refused.  3. AKI on CKD-creat slightly down today, but this could be contributing to confusion. Nephrology following.  Will f/up after labs result.  4. Mable Fill has a known right ankle infection and is facing an amputation. He is on abx. Nephrology has him on Lasix now, so do not want to hydrate.  KJKG, NP [facility] Update: See labs above. Recheck ammonia in am and if trending upwards, will treat. Went back to see pt. Resting in bed. No respiratory distress. O2 sat normal. Temp down to 100.2.  KJKG, NP [facility]
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
## 2019-12-08 NOTE — Progress Notes (Signed)
Lab called with a low total calcium of 6.8 informed docotor

## 2019-12-08 NOTE — Progress Notes (Signed)
Temp Rectal 102. Forrest Moron N.P. and Erin Hearing. with rapid resp. Notified. David into see patient . Noe Gens N.P. on her way to come see patient. Patient is confused and seeing yellow cat in room and yellow door. He thinks he is in his Mother's backyard. Was not confused earlier. Skin warm and dry resp lab. Using Abdo. Muscles. Resp 26-30 sats. mid 90's -100 . H.R. [address] S. T. See vital sign flow sheet for vitals." I have to pee." patient stated . He is unable to bladder scan 172 ml.

## 2019-12-08 NOTE — Progress Notes (Signed)
MEDICATION RELATED CONSULT NOTE - INITIAL   Pharmacy Consult for Calcium Administration Indication: Hypocalcemia  No Known Allergies  Patient Measurements: Height: 5\' 9"  (175.3 cm) Weight: 229 lb 4.5 oz (104 kg) IBW/kg (Calculated) : 70.7  Vital Signs: Temp: 98.4 F (36.9 C) (12/26 0812) Temp Source: Oral (12/26 0812) BP: 116/69 (12/26 0812) Pulse Rate: 102 (12/26 0812) Intake/Output from previous day: 12/25 0701 - 12/26 0700 In: 360 [P.O.:360] Out: 905 [Urine:905] Intake/Output from this shift: No intake/output data recorded.  Labs: Recent Labs    12/06/19 0933 12/06/19 1221 12/06/19 1358 12/07/19 0238 12/07/19 1635 12/08/19 0326  WBC 8.2  --   --  5.7  --   --   HGB 8.5* 9.5*  --  7.6*  --   --   HCT 29.0* 28.0*  --  25.6*  --   --   PLT 225  --   --  156  --   --   APTT  --   --  36  --   --   --   CREATININE 5.56*  --   --  5.36* 5.35* 5.08*  MG  --   --  1.1* 1.2* 2.3  --   PHOS  --   --  5.3*  --   --  4.7*  ALBUMIN 2.5*  --   --  2.1* 2.0* 1.9*  PROT 6.8  --   --  6.0* 5.8*  --   AST 52*  --   --  34 29  --   ALT 286*  --   --  146* 81*  --   ALKPHOS 122  --   --  92 91  --   BILITOT 2.2*  --   --  1.4* 1.4*  --    Estimated Creatinine Clearance: 20.7 mL/min (A) (by C-G formula based on SCr of 5.08 mg/dL (H)).   Microbiology: Recent Results (from the past 720 hour(s))  Respiratory Panel by RT PCR (Flu A&B, Covid) - Nasopharyngeal Swab     Status: None   Collection Time: 12/06/19  9:33 AM   Specimen: Nasopharyngeal Swab  Result Value Ref Range Status   SARS Coronavirus 2 by RT PCR NEGATIVE NEGATIVE Final    Comment: (NOTE) SARS-CoV-2 target nucleic acids are NOT DETECTED. The SARS-CoV-2 RNA is generally detectable in upper respiratoy specimens during the acute phase of infection. The lowest concentration of SARS-CoV-2 viral copies this assay can detect is 131 copies/mL. A negative result does not preclude SARS-Cov-2 infection and should not be  used as the sole basis for treatment or other patient management decisions. A negative result may occur with  improper specimen collection/handling, submission of specimen other than nasopharyngeal swab, presence of viral mutation(s) within the areas targeted by this assay, and inadequate number of viral copies (<131 copies/mL). A negative result must be combined with clinical observations, patient history, and epidemiological information. The expected result is Negative. Fact Sheet for Patients:  PinkCheek.be Fact Sheet for Healthcare Providers:  GravelBags.it This test is not yet ap proved or cleared by the [place] FDA and  has been authorized for detection and/or diagnosis of SARS-CoV-2 by FDA under an Emergency Use Authorization (EUA). This EUA will remain  in effect (meaning this test can be used) for the duration of the COVID-19 declaration under Section 564(b)(1) of the Act, 21 U.S.C. section 360bbb-3(b)(1), unless the authorization is terminated or revoked sooner.    Influenza A by PCR NEGATIVE NEGATIVE Final  Influenza B by PCR NEGATIVE NEGATIVE Final    Comment: (NOTE) The Xpert Xpress SARS-CoV-2/FLU/RSV assay is intended as an aid in  the diagnosis of influenza from Nasopharyngeal swab specimens and  should not be used as a sole basis for treatment. Nasal washings and  aspirates are unacceptable for Xpert Xpress SARS-CoV-2/FLU/RSV  testing. Fact Sheet for Patients: PinkCheek.be Fact Sheet for Healthcare Providers: GravelBags.it This test is not yet approved or cleared by the [place] FDA and  has been authorized for detection and/or diagnosis of SARS-CoV-2 by  FDA under an Emergency Use Authorization (EUA). This EUA will remain  in effect (meaning this test can be used) for the duration of the  Covid-19 declaration under Section 564(b)(1) of the  Act, 21  U.S.C. section 360bbb-3(b)(1), unless the authorization is  terminated or revoked. Performed at [facility], [place] [address], [place], [place] [address]   Blood culture (routine x 2)     Status: None (Preliminary result)   Collection Time: 12/06/19  9:36 AM   Specimen: BLOOD  Result Value Ref Range Status   Specimen Description BLOOD RIGHT ANTECUBITAL  Final   Special Requests   Final    BOTTLES DRAWN AEROBIC AND ANAEROBIC Blood Culture adequate volume   Culture   Final    NO GROWTH 2 DAYS Performed at [facility], [place] [address] [place], [place] [address]    Report Status PENDING  Incomplete  Blood culture (routine x 2)     Status: None (Preliminary result)   Collection Time: 12/06/19  9:37 AM   Specimen: BLOOD  Result Value Ref Range Status   Specimen Description BLOOD LEFT ANTECUBITAL  Final   Special Requests   Final    BOTTLES DRAWN AEROBIC AND ANAEROBIC Blood Culture adequate volume   Culture   Final    NO GROWTH 2 DAYS Performed at [facility], [place] [address], [place], [place] [address]    Report Status PENDING  Incomplete  Surgical pcr screen     Status: None   Collection Time: 12/06/19 11:45 PM   Specimen: Nasal Mucosa; Nasal Swab  Result Value Ref Range Status   MRSA, PCR NEGATIVE NEGATIVE Final   Staphylococcus aureus NEGATIVE NEGATIVE Final    Comment: (NOTE) The Xpert SA Assay (FDA approved for NASAL specimens in patients 66 years of age and older), is one component of a comprehensive surveillance program. It is not intended to diagnose infection nor to guide or monitor treatment. Performed at [facility], [place] [address] [place], [place] [address]     Medical History: Past Medical History:  Diagnosis Date  . Anemia of chronic disease   . CHF (congestive heart failure) ([place])   . CKD (chronic kidney disease)   . Depression   . Diabetes mellitus without complication ([place])   . HLD (hyperlipidemia)   . Hypertension    . Obesity     Medications:  Scheduled:  . clopidogrel  75 mg Oral Daily  . darbepoetin (ARANESP) injection - NON-DIALYSIS  150 mcg Subcutaneous Q Fri-1800  . insulin aspart  0-9 Units Subcutaneous Q4H  . insulin glargine  10 Units Subcutaneous QHS  . isosorbide mononitrate  30 mg Oral Daily  . metoprolol succinate  25 mg Oral BID  . QUEtiapine Fumarate  150 mg Oral QHS   Infusions:  . calcium gluconate      Assessment: Patient has a corrected calcium of 7.7. He is still hypocalcemic  despite receiving 2grams of calcium gluconate on 12/25.  Goal of Therapy:  Ionized calcium 1-1.12 mmol/L Total Calcium 8.9-10.3 mg/dL  Plan:  Calcium gluconate IV 3grams x 1 Check ionized calcium level with AM labs  Sherren Kerns, PharmD PGY1 Acute Care Pharmacy Resident 12/08/2019,8:50 AM

## 2019-12-09 ENCOUNTER — Encounter (HOSPITAL_COMMUNITY)
Admission: EM | Disposition: A | Discharge status: Home Health | Payer: Self-pay | Source: Home / Self Care | Attending: Internal Medicine

## 2019-12-09 ENCOUNTER — Encounter (HOSPITAL_COMMUNITY): Payer: Self-pay | Admitting: Internal Medicine

## 2019-12-09 ENCOUNTER — Inpatient Hospital Stay (HOSPITAL_COMMUNITY): Payer: Medicare PPO | Admitting: Certified Registered"

## 2019-12-09 DIAGNOSIS — E11622 Type 2 diabetes mellitus with other skin ulcer: Secondary | ICD-10-CM

## 2019-12-09 DIAGNOSIS — E1165 Type 2 diabetes mellitus with hyperglycemia: Secondary | ICD-10-CM

## 2019-12-09 DIAGNOSIS — I70229 Atherosclerosis of native arteries of extremities with rest pain, unspecified extremity: Secondary | ICD-10-CM

## 2019-12-09 DIAGNOSIS — IMO0002 Reserved for concepts with insufficient information to code with codable children: Secondary | ICD-10-CM

## 2019-12-09 DIAGNOSIS — I998 Other disorder of circulatory system: Secondary | ICD-10-CM

## 2019-12-09 DIAGNOSIS — L97909 Non-pressure chronic ulcer of unspecified part of unspecified lower leg with unspecified severity: Secondary | ICD-10-CM

## 2019-12-09 HISTORY — PX: AMPUTATION: SHX166

## 2019-12-09 HISTORY — PX: BELOW KNEE LEG AMPUTATION: SUR23

## 2019-12-09 LAB — PROTIME-INR
INR: 1.5 — ABNORMAL HIGH (ref 0.8–1.2)
Prothrombin Time: 18.1 seconds — ABNORMAL HIGH (ref 11.4–15.2)

## 2019-12-09 LAB — COMPREHENSIVE METABOLIC PANEL
ALT: 29 U/L (ref 0–44)
AST: 23 U/L (ref 15–41)
Albumin: 2.1 g/dL — ABNORMAL LOW (ref 3.5–5.0)
Alkaline Phosphatase: 110 U/L (ref 38–126)
Anion gap: 15 (ref 5–15)
BUN: 93 mg/dL — ABNORMAL HIGH (ref 6–20)
CO2: 15 mmol/L — ABNORMAL LOW (ref 22–32)
Calcium: 8 mg/dL — ABNORMAL LOW (ref 8.9–10.3)
Chloride: 102 mmol/L (ref 98–111)
Creatinine, Ser: 4.79 mg/dL — ABNORMAL HIGH (ref 0.61–1.24)
GFR calc Af Amer: 15 mL/min — ABNORMAL LOW (ref >60)
GFR calc non Af Amer: 13 mL/min — ABNORMAL LOW (ref >60)
Glucose, Bld: 246 mg/dL — ABNORMAL HIGH (ref 70–99)
Potassium: 4.5 mmol/L (ref 3.5–5.1)
Sodium: 132 mmol/L — ABNORMAL LOW (ref 135–145)
Total Bilirubin: 1 mg/dL (ref 0.3–1.2)
Total Protein: 6.6 g/dL (ref 6.5–8.1)

## 2019-12-09 LAB — FOLATE RBC
Folate, Hemolysate: 339 ng/mL
Folate, RBC: 1228 ng/mL (ref >498)
Hematocrit: 27.6 % — ABNORMAL LOW (ref 37.5–51.0)

## 2019-12-09 LAB — CBC
HCT: 26.1 % — ABNORMAL LOW (ref 39.0–52.0)
Hemoglobin: 7.5 g/dL — ABNORMAL LOW (ref 13.0–17.0)
MCH: 24.8 pg — ABNORMAL LOW (ref 26.0–34.0)
MCHC: 28.7 g/dL — ABNORMAL LOW (ref 30.0–36.0)
MCV: 86.4 fL (ref 80.0–100.0)
Platelets: 175 10*3/uL (ref 150–400)
RBC: 3.02 MIL/uL — ABNORMAL LOW (ref 4.22–5.81)
RDW: 17.2 % — ABNORMAL HIGH (ref 11.5–15.5)
WBC: 5.6 10*3/uL (ref 4.0–10.5)
nRBC: 0 % (ref 0.0–0.2)

## 2019-12-09 LAB — RENAL FUNCTION PANEL
Albumin: 2.2 g/dL — ABNORMAL LOW (ref 3.5–5.0)
Anion gap: 15 (ref 5–15)
BUN: 93 mg/dL — ABNORMAL HIGH (ref 6–20)
CO2: 17 mmol/L — ABNORMAL LOW (ref 22–32)
Calcium: 7.7 mg/dL — ABNORMAL LOW (ref 8.9–10.3)
Chloride: 104 mmol/L (ref 98–111)
Creatinine, Ser: 4.58 mg/dL — ABNORMAL HIGH (ref 0.61–1.24)
GFR calc Af Amer: 16 mL/min — ABNORMAL LOW (ref >60)
GFR calc non Af Amer: 14 mL/min — ABNORMAL LOW (ref >60)
Glucose, Bld: 228 mg/dL — ABNORMAL HIGH (ref 70–99)
Phosphorus: 6.3 mg/dL — ABNORMAL HIGH (ref 2.5–4.6)
Potassium: 4.6 mmol/L (ref 3.5–5.1)
Sodium: 136 mmol/L (ref 135–145)

## 2019-12-09 LAB — GLUCOSE, CAPILLARY
Glucose-Capillary: 189 mg/dL — ABNORMAL HIGH (ref 70–99)
Glucose-Capillary: 209 mg/dL — ABNORMAL HIGH (ref 70–99)
Glucose-Capillary: 253 mg/dL — ABNORMAL HIGH (ref 70–99)
Glucose-Capillary: 189 mg/dL — ABNORMAL HIGH (ref 70–99)
Glucose-Capillary: 252 mg/dL — ABNORMAL HIGH (ref 70–99)
Glucose-Capillary: 286 mg/dL — ABNORMAL HIGH (ref 70–99)

## 2019-12-09 LAB — AMMONIA
Ammonia: 43 umol/L — ABNORMAL HIGH (ref 9–35)
Ammonia: 24 umol/L (ref 9–35)

## 2019-12-09 LAB — LACTIC ACID, PLASMA: Lactic Acid, Venous: 1.4 mmol/L (ref 0.5–1.9)

## 2019-12-09 SURGERY — AMPUTATION BELOW KNEE
Anesthesia: General | Site: Knee | Laterality: Right

## 2019-12-09 MED ORDER — PHENYLEPHRINE 40 MCG/ML (10ML) SYRINGE FOR IV PUSH (FOR BLOOD PRESSURE SUPPORT)
PREFILLED_SYRINGE | INTRAVENOUS | Status: AC
  Filled 2019-12-09: qty 10

## 2019-12-09 MED ORDER — LIDOCAINE 2% (20 MG/ML) 5 ML SYRINGE
INTRAMUSCULAR | Status: DC | PRN
  Administered 2019-12-09: 100 mg via INTRAVENOUS

## 2019-12-09 MED ORDER — ALBUMIN HUMAN 5 % IV SOLN: INTRAVENOUS | Status: DC | PRN

## 2019-12-09 MED ORDER — MIDAZOLAM HCL 2 MG/2ML IJ SOLN
INTRAMUSCULAR | Status: AC
  Filled 2019-12-09: qty 2

## 2019-12-09 MED ORDER — LIDOCAINE 2% (20 MG/ML) 5 ML SYRINGE
INTRAMUSCULAR | Status: AC
  Filled 2019-12-09: qty 5

## 2019-12-09 MED ORDER — PHENYLEPHRINE HCL-NACL 10-0.9 MG/250ML-% IV SOLN
INTRAVENOUS | Status: DC | PRN
  Administered 2019-12-09: 60 ug/min via INTRAVENOUS

## 2019-12-09 MED ORDER — CEFAZOLIN SODIUM-DEXTROSE 2-3 GM-%(50ML) IV SOLR
INTRAVENOUS | Status: DC | PRN
  Administered 2019-12-09: 2 g via INTRAVENOUS

## 2019-12-09 MED ORDER — GLYCOPYRROLATE PF 0.2 MG/ML IJ SOSY
PREFILLED_SYRINGE | INTRAMUSCULAR | Status: DC | PRN
  Administered 2019-12-09: .1 mg via INTRAVENOUS

## 2019-12-09 MED ORDER — PROPOFOL 10 MG/ML IV BOLUS
INTRAVENOUS | Status: AC
  Filled 2019-12-09: qty 20

## 2019-12-09 MED ORDER — SODIUM CHLORIDE 0.9 % IV SOLN: INTRAVENOUS | Status: DC | PRN

## 2019-12-09 MED ORDER — PROPOFOL 10 MG/ML IV BOLUS
INTRAVENOUS | Status: DC | PRN
  Administered 2019-12-09: 80 mg via INTRAVENOUS

## 2019-12-09 MED ORDER — ONDANSETRON HCL 4 MG/2ML IJ SOLN
INTRAMUSCULAR | Status: DC | PRN
  Administered 2019-12-09: 4 mg via INTRAVENOUS

## 2019-12-09 MED ORDER — 0.9 % SODIUM CHLORIDE (POUR BTL) OPTIME
TOPICAL | Status: DC | PRN
  Administered 2019-12-09: 1000 mL

## 2019-12-09 MED ORDER — EPHEDRINE 5 MG/ML INJ
INTRAVENOUS | Status: AC
  Filled 2019-12-09: qty 10

## 2019-12-09 MED ORDER — EPHEDRINE SULFATE-NACL 50-0.9 MG/10ML-% IV SOSY
PREFILLED_SYRINGE | INTRAVENOUS | Status: DC | PRN
  Administered 2019-12-09: 10 mg via INTRAVENOUS
  Administered 2019-12-09 (×2): 5 mg via INTRAVENOUS

## 2019-12-09 MED ORDER — DEXAMETHASONE SODIUM PHOSPHATE 4 MG/ML IJ SOLN
INTRAMUSCULAR | Status: DC | PRN
  Administered 2019-12-09: 4 mg via INTRAVENOUS

## 2019-12-09 MED ORDER — SUCCINYLCHOLINE CHLORIDE 200 MG/10ML IV SOSY
PREFILLED_SYRINGE | INTRAVENOUS | Status: AC
  Filled 2019-12-09: qty 10

## 2019-12-09 MED ORDER — BACITRACIN ZINC 500 UNIT/GM EX OINT
TOPICAL_OINTMENT | CUTANEOUS | Status: DC | PRN
  Administered 2019-12-09: 1 via TOPICAL

## 2019-12-09 MED ORDER — MAGNESIUM SULFATE 2 GM/50ML IV SOLN: 2.0000 g | Freq: Every day | INTRAVENOUS | Status: DC | PRN

## 2019-12-09 MED ORDER — ROCURONIUM BROMIDE 10 MG/ML (PF) SYRINGE
PREFILLED_SYRINGE | INTRAVENOUS | Status: AC
  Filled 2019-12-09: qty 10

## 2019-12-09 MED ORDER — DOCUSATE SODIUM 100 MG PO CAPS
100.0000 mg | ORAL_CAPSULE | Freq: Every day | ORAL | Status: DC
  Administered 2019-12-10 – 2019-12-16 (×7): 100 mg via ORAL
  Filled 2019-12-09 (×7): qty 1

## 2019-12-09 MED ORDER — MEPERIDINE HCL 25 MG/ML IJ SOLN: 6.2500 mg | INTRAMUSCULAR | Status: DC | PRN

## 2019-12-09 MED ORDER — DEXAMETHASONE SODIUM PHOSPHATE 10 MG/ML IJ SOLN
INTRAMUSCULAR | Status: AC
  Filled 2019-12-09: qty 1

## 2019-12-09 MED ORDER — POTASSIUM CHLORIDE CRYS ER 20 MEQ PO TBCR: 20.0000 meq | EXTENDED_RELEASE_TABLET | Freq: Every day | ORAL | Status: DC | PRN

## 2019-12-09 MED ORDER — FENTANYL CITRATE (PF) 250 MCG/5ML IJ SOLN
INTRAMUSCULAR | Status: AC
  Filled 2019-12-09: qty 5

## 2019-12-09 MED ORDER — ONDANSETRON HCL 4 MG/2ML IJ SOLN
INTRAMUSCULAR | Status: AC
  Filled 2019-12-09: qty 2

## 2019-12-09 MED ORDER — PANTOPRAZOLE SODIUM 40 MG PO TBEC
40.0000 mg | DELAYED_RELEASE_TABLET | Freq: Every day | ORAL | Status: DC
  Administered 2019-12-09 – 2019-12-16 (×8): 40 mg via ORAL
  Filled 2019-12-09 (×8): qty 1

## 2019-12-09 MED ORDER — BACITRACIN ZINC 500 UNIT/GM EX OINT
TOPICAL_OINTMENT | CUTANEOUS | Status: AC
  Filled 2019-12-09: qty 28.35

## 2019-12-09 MED ORDER — FENTANYL CITRATE (PF) 100 MCG/2ML IJ SOLN
INTRAMUSCULAR | Status: DC | PRN
  Administered 2019-12-09 (×2): 50 ug via INTRAVENOUS
  Administered 2019-12-09: 25 ug via INTRAVENOUS
  Administered 2019-12-09: 50 ug via INTRAVENOUS
  Administered 2019-12-09: 25 ug via INTRAVENOUS

## 2019-12-09 MED ORDER — PHENYLEPHRINE 40 MCG/ML (10ML) SYRINGE FOR IV PUSH (FOR BLOOD PRESSURE SUPPORT)
PREFILLED_SYRINGE | INTRAVENOUS | Status: DC | PRN
  Administered 2019-12-09: 80 ug via INTRAVENOUS
  Administered 2019-12-09: 120 ug via INTRAVENOUS
  Administered 2019-12-09 (×2): 80 ug via INTRAVENOUS

## 2019-12-09 MED ORDER — OXYCODONE-ACETAMINOPHEN 5-325 MG PO TABS
1.0000 | ORAL_TABLET | ORAL | Status: DC | PRN
  Administered 2019-12-09 – 2019-12-13 (×12): 2 via ORAL
  Filled 2019-12-09 (×13): qty 2

## 2019-12-09 MED ORDER — PHENOL 1.4 % MT LIQD: 1.0000 | OROMUCOSAL | Status: DC | PRN

## 2019-12-09 MED ORDER — HYDROMORPHONE HCL 1 MG/ML IJ SOLN: 0.2500 mg | INTRAMUSCULAR | Status: DC | PRN

## 2019-12-09 MED ORDER — CEFAZOLIN SODIUM-DEXTROSE 2-4 GM/100ML-% IV SOLN
INTRAVENOUS | Status: AC
  Filled 2019-12-09: qty 100

## 2019-12-09 MED ORDER — MORPHINE SULFATE (PF) 2 MG/ML IV SOLN
2.0000 mg | INTRAVENOUS | Status: DC | PRN
  Administered 2019-12-09 – 2019-12-13 (×13): 2 mg via INTRAVENOUS
  Filled 2019-12-09 (×13): qty 1

## 2019-12-09 MED ORDER — ONDANSETRON HCL 4 MG/2ML IJ SOLN: 4.0000 mg | Freq: Once | INTRAMUSCULAR | Status: DC | PRN

## 2019-12-09 SURGICAL SUPPLY — 57 items
BANDAGE ESMARK 6X9 LF (GAUZE/BANDAGES/DRESSINGS) IMPLANT
BLADE SAW SGTL 73X25 THK (BLADE) ×2 IMPLANT
BNDG COHESIVE 6X5 TAN STRL LF (GAUZE/BANDAGES/DRESSINGS) ×2 IMPLANT
BNDG ELASTIC 4X5.8 VLCR STR LF (GAUZE/BANDAGES/DRESSINGS) ×2 IMPLANT
BNDG ELASTIC 6X15 VLCR STRL LF (GAUZE/BANDAGES/DRESSINGS) ×2 IMPLANT
BNDG ELASTIC 6X5.8 VLCR STR LF (GAUZE/BANDAGES/DRESSINGS) ×2 IMPLANT
BNDG ESMARK 6X9 LF (GAUZE/BANDAGES/DRESSINGS)
BNDG GAUZE ELAST 4 BULKY (GAUZE/BANDAGES/DRESSINGS) ×2 IMPLANT
CANISTER SUCT 3000ML PPV (MISCELLANEOUS) ×2 IMPLANT
CLIP VESOCCLUDE MED 6/CT (CLIP) IMPLANT
COVER BACK TABLE 60X90IN (DRAPES) IMPLANT
COVER SURGICAL LIGHT HANDLE (MISCELLANEOUS) ×2 IMPLANT
COVER WAND RF STERILE (DRAPES) ×2 IMPLANT
CUFF TOURN SGL QUICK 24 (TOURNIQUET CUFF)
CUFF TOURN SGL QUICK 34 (TOURNIQUET CUFF)
CUFF TOURN SGL QUICK 42 (TOURNIQUET CUFF) IMPLANT
CUFF TRNQT CYL 24X4X16.5-23 (TOURNIQUET CUFF) IMPLANT
CUFF TRNQT CYL 34X4.125X (TOURNIQUET CUFF) IMPLANT
DRAIN CHANNEL 19F RND (DRAIN) IMPLANT
DRAPE HALF SHEET 40X57 (DRAPES) ×2 IMPLANT
DRAPE ORTHO SPLIT 77X108 STRL (DRAPES) ×2
DRAPE SURG ORHT 6 SPLT 77X108 (DRAPES) ×2 IMPLANT
DRSG ADAPTIC 3X8 NADH LF (GAUZE/BANDAGES/DRESSINGS) ×2 IMPLANT
ELECT REM PT RETURN 9FT ADLT (ELECTROSURGICAL) ×2
ELECTRODE REM PT RTRN 9FT ADLT (ELECTROSURGICAL) ×1 IMPLANT
EVACUATOR SILICONE 100CC (DRAIN) IMPLANT
GAUZE SPONGE 4X4 12PLY STRL (GAUZE/BANDAGES/DRESSINGS) ×4 IMPLANT
GAUZE SPONGE 4X4 12PLY STRL LF (GAUZE/BANDAGES/DRESSINGS) ×2 IMPLANT
GLOVE BIO SURGEON STRL SZ7.5 (GLOVE) ×2 IMPLANT
GLOVE BIOGEL PI IND STRL 8 (GLOVE) ×1 IMPLANT
GLOVE BIOGEL PI INDICATOR 8 (GLOVE) ×1
GOWN STRL REUS W/ TWL LRG LVL3 (GOWN DISPOSABLE) ×2 IMPLANT
GOWN STRL REUS W/ TWL XL LVL3 (GOWN DISPOSABLE) ×2 IMPLANT
GOWN STRL REUS W/TWL LRG LVL3 (GOWN DISPOSABLE) ×2
GOWN STRL REUS W/TWL XL LVL3 (GOWN DISPOSABLE) ×2
KIT BASIN OR (CUSTOM PROCEDURE TRAY) ×2 IMPLANT
KIT TURNOVER KIT B (KITS) ×2 IMPLANT
NS IRRIG 1000ML POUR BTL (IV SOLUTION) ×2 IMPLANT
PACK GENERAL/GYN (CUSTOM PROCEDURE TRAY) ×2 IMPLANT
PAD ARMBOARD 7.5X6 YLW CONV (MISCELLANEOUS) ×4 IMPLANT
STAPLER VISISTAT 35W (STAPLE) ×2 IMPLANT
STOCKINETTE IMPERVIOUS LG (DRAPES) ×2 IMPLANT
SUT ETHILON 3 0 PS 1 (SUTURE) IMPLANT
SUT SILK 0 TIES 10X30 (SUTURE) IMPLANT
SUT SILK 2 0 (SUTURE) ×1
SUT SILK 2 0 SH CR/8 (SUTURE) ×2 IMPLANT
SUT SILK 2-0 18XBRD TIE 12 (SUTURE) ×1 IMPLANT
SUT SILK 3 0 (SUTURE) ×1
SUT SILK 3-0 18XBRD TIE 12 (SUTURE) ×1 IMPLANT
SUT VIC AB 2-0 CT1 18 (SUTURE) ×6 IMPLANT
SUT VIC AB 3-0 SH 18 (SUTURE) IMPLANT
SWAB COLLECTION DEVICE MRSA (MISCELLANEOUS) ×2 IMPLANT
SWAB CULTURE ESWAB REG 1ML (MISCELLANEOUS) ×2 IMPLANT
TAPE UMBILICAL COTTON 1/8X30 (MISCELLANEOUS) ×2 IMPLANT
TOWEL GREEN STERILE (TOWEL DISPOSABLE) ×4 IMPLANT
UNDERPAD 30X30 (UNDERPADS AND DIAPERS) ×2 IMPLANT
WATER STERILE IRR 1000ML POUR (IV SOLUTION) ×2 IMPLANT
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
## 2019-12-09 NOTE — Progress Notes (Signed)
Admit: 12/06/2019 LOS: 3  7M with nonhealing ankle wound after fracture s/p BKA 12/27; likely progressive CKD now advanced  Subjective:  Marland Kitchen R BKA earlier today . Slight improvement in SCr, K and HCO3 ok . No c/o from pt . > 0.5L UOP, 2 unmeasured voids . BCx NGTD  12/26 0701 - 12/27 0700 In: 560 [P.O.:360; IV Piggyback:200] Out: 560 [Urine:560]  Filed Weights   12/08/19 0427 12/08/19 0428 12/09/19 0435  Weight: 104 kg 104 kg 102.3 kg    Scheduled Meds: . clopidogrel  75 mg Oral Daily  . darbepoetin (ARANESP) injection - NON-DIALYSIS  150 mcg Subcutaneous Q Fri-1800  . [START ON 12/10/2019] docusate sodium  100 mg Oral Daily  . feeding supplement (ENSURE ENLIVE)  237 mL Oral BID BM  . furosemide  40 mg Oral BID  . insulin aspart  0-9 Units Subcutaneous Q4H  . insulin glargine  10 Units Subcutaneous QHS  . isosorbide mononitrate  30 mg Oral Daily  . metoprolol succinate  25 mg Oral BID  . multivitamin  1 tablet Oral Daily  . nutrition supplement (JUVEN)  1 packet Oral BID BM  . pantoprazole  40 mg Oral Daily   Continuous Infusions: . ceFAZolin    . cefTRIAXone (ROCEPHIN)  IV 2 g (12/09/19 1312)   And  . metronidazole 500 mg (12/09/19 1414)  . magnesium sulfate bolus IVPB     PRN Meds:.acetaminophen **OR** acetaminophen, dextrose, magnesium sulfate bolus IVPB, morphine injection, ondansetron **OR** ondansetron (ZOFRAN) IV, oxyCODONE-acetaminophen, phenol, potassium chloride  Current Labs: reviewed  Results for NEHAMIAH, HIMMELSBACH (MRN [id number]) as of 12/09/2019 15:55  Ref. Range 12/08/2019 03:26  Saturation Ratios Latest Ref Range: 17.9 - 39.5 % 5 (L)  Ferritin Latest Ref Range: 24 - 336 ng/mL 1,180 (H)    Physical Exam:  Blood pressure 132/71, pulse (!) 101, temperature 98 F (36.7 C), temperature source Oral, resp. rate 18, height 5\' 9"  (1.753 m), weight 102.3 kg, SpO2 99 %. R BKA Stump bandaged, dressings clean Regular, nl rate, no rub, nl  s1s2 CTAB S/nt/nd  A 1. 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  Stable GFR at this time. K OK, no reason for HD at this point.  CTM  2. HTN/Vol; on lasix 40 BID, follow UP.  BP stable 3. Ankle wound s/p R BKA 4. Anemia: on ESA, needs Fe will start tomorrow after ABX stopped  P . As above, cont to trend labs . WIll need Fe, perhaps can use fereheme . Daily weights, Daily Renal Panel, Strict I/Os, Avoid nephrotoxins (NSAIDs, judicious IV Contrast)    [name] MD 12/09/2019, 3:52 PM  Recent Labs  Lab 12/06/19 1358 12/06/19 2223 12/08/19 0326 12/08/19 1816 12/08/19 2315 12/09/19 1118  NA  --   --  138 134* 132* 136  K  --   --  4.3 4.3 4.5 4.6  CL  --   --  105 104 102 104  CO2  --   --  17* 18* 15* 17*  GLUCOSE  --   --  152* 279* 246* 228*  BUN  --   --  91* 93* 93* 93*  CREATININE  --   --  5.08* 4.92* 4.79* 4.58*  CALCIUM  --    < > 7.0* 7.8* 8.0* 7.7*  PHOS 5.3*  --  4.7*  --   --  6.3*   < > = values in this interval not displayed.   Recent Labs  Lab 12/06/19 [phone] 12/06/19  1221 12/08/19 1001 12/08/19 2315 12/09/19 1118  WBC 8.2   < > 6.8 10.2 5.6  NEUTROABS 5.9  --   --  8.3*  --   HGB 8.5*  --  8.0* 7.6* 7.5*  HCT 29.0*   < > 26.5* 25.1* 26.1*  MCV 83.8   < > 82.0 82.3 86.4  PLT 225   < > 170 192 175   < > = values in this interval not displayed.
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
## 2019-12-09 NOTE — Anesthesia Preprocedure Evaluation (Addendum)
Anesthesia Evaluation  Patient identified by MRN, date of birth, ID band Patient awake    Reviewed: Allergy & Precautions, NPO status , Patient's Chart, lab work & pertinent test results  Airway Mallampati: II  TM Distance: >3 FB Neck ROM: Full    Dental  (+) Missing, Dental Advisory Given,    Pulmonary former smoker,    Pulmonary exam normal        Cardiovascular hypertension, Pt. on medications + CAD, + Cardiac Stents and + Peripheral Vascular Disease  Normal cardiovascular exam     Neuro/Psych Depression    GI/Hepatic   Endo/Other  diabetes, Type 2, Insulin Dependent, Oral Hypoglycemic Agents  Renal/GU Renal InsufficiencyRenal disease     Musculoskeletal   Abdominal   Peds  Hematology   Anesthesia Other Findings   Reproductive/Obstetrics                           Anesthesia Physical Anesthesia Plan  ASA: III  Anesthesia Plan: General   Post-op Pain Management:    Induction: Intravenous  PONV Risk Score and Plan: 2  Airway Management Planned: LMA  Additional Equipment:   Intra-op Plan:   Post-operative Plan: Extubation in OR  Informed Consent: I have reviewed the patients History and Physical, chart, labs and discussed the procedure including the risks, benefits and alternatives for the proposed anesthesia with the patient or authorized representative who has indicated his/her understanding and acceptance.       Plan Discussed with: CRNA and Surgeon  Anesthesia Plan Comments:         Anesthesia Quick Evaluation

## 2019-12-09 NOTE — Progress Notes (Signed)
Patient keeps taking o2 sat probe and B.P. cuff off

## 2019-12-09 NOTE — Anesthesia Procedure Notes (Signed)
Procedure Name: LMA Insertion Date/Time: 12/09/2019 7:57 AM Performed by: Orlie Dakin, CRNA Pre-anesthesia Checklist: Patient identified, Emergency Drugs available, Suction available and Patient being monitored Patient Re-evaluated:Patient Re-evaluated prior to induction Oxygen Delivery Method: Circle system utilized Preoxygenation: Pre-oxygenation with 100% oxygen Induction Type: IV induction LMA: LMA inserted LMA Size: 5.0 Tube type: Oral Number of attempts: 1 Placement Confirmation: positive ETCO2 and breath sounds checked- equal and bilateral Tube secured with: Tape Dental Injury: Teeth and Oropharynx as per pre-operative assessment
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
## 2019-12-09 NOTE — Progress Notes (Signed)
Text page Forrest Moron N.P.  That lab results are back and to review them also made her aware of Rectal temp. 100.2 now.

## 2019-12-09 NOTE — Anesthesia Postprocedure Evaluation (Signed)
Anesthesia Post Note  Patient: Oceanographer  Procedure(s) Performed: RIGHT BELOW KNEE AMPUTATION (Right Knee)     Patient location during evaluation: PACU Anesthesia Type: General Level of consciousness: awake and alert Pain management: pain level controlled Vital Signs Assessment: post-procedure vital signs reviewed and stable Respiratory status: spontaneous breathing, nonlabored ventilation, respiratory function stable and patient connected to nasal cannula oxygen Cardiovascular status: blood pressure returned to baseline and stable Postop Assessment: no apparent nausea or vomiting Anesthetic complications: no    Last Vitals:  Vitals:   12/09/19 0935 12/09/19 1006  BP: 122/74 132/71  Pulse: (!) 106 (!) 101  Resp: (!) 21 18  Temp: 36.7 C 36.7 C  SpO2: 94% 99%    Last Pain:  Vitals:   12/09/19 1006  TempSrc: Oral  PainSc: 10-Worst pain ever                 Normon Pettijohn DAVID

## 2019-12-09 NOTE — Transfer of Care (Signed)
Immediate Anesthesia Transfer of Care Note  Patient: Ernest [name]  Procedure(s) Performed: RIGHT BELOW KNEE AMPUTATION (Right Knee)  Patient Location: PACU  Anesthesia Type:General  Level of Consciousness: drowsy  Airway & Oxygen Therapy: Patient Spontanous Breathing and Patient connected to face mask oxygen  Post-op Assessment: Report given to RN and Post -op Vital signs reviewed and stable  Post vital signs: Reviewed and stable  Last Vitals:  Vitals Value Taken Time  BP 120/74 12/09/19 0923  Temp    Pulse 104 12/09/19 0924  Resp 28 12/09/19 0924  SpO2 93 % 12/09/19 0924  Vitals shown include unvalidated device data.  Last Pain:  Vitals:   12/09/19 0920  TempSrc:   PainSc: (P) 0-No pain         Complications: No apparent anesthesia complications

## 2019-12-10 LAB — PROTIME-INR
INR: 1.4 — ABNORMAL HIGH (ref 0.8–1.2)
Prothrombin Time: 17 seconds — ABNORMAL HIGH (ref 11.4–15.2)

## 2019-12-10 LAB — GLUCOSE, CAPILLARY
Glucose-Capillary: 244 mg/dL — ABNORMAL HIGH (ref 70–99)
Glucose-Capillary: 260 mg/dL — ABNORMAL HIGH (ref 70–99)
Glucose-Capillary: 261 mg/dL — ABNORMAL HIGH (ref 70–99)
Glucose-Capillary: 268 mg/dL — ABNORMAL HIGH (ref 70–99)
Glucose-Capillary: 295 mg/dL — ABNORMAL HIGH (ref 70–99)
Glucose-Capillary: 330 mg/dL — ABNORMAL HIGH (ref 70–99)

## 2019-12-10 LAB — RENAL FUNCTION PANEL
Albumin: 2.1 g/dL — ABNORMAL LOW (ref 3.5–5.0)
Anion gap: 15 (ref 5–15)
BUN: 103 mg/dL — ABNORMAL HIGH (ref 6–20)
CO2: 17 mmol/L — ABNORMAL LOW (ref 22–32)
Calcium: 7.5 mg/dL — ABNORMAL LOW (ref 8.9–10.3)
Chloride: 102 mmol/L (ref 98–111)
Creatinine, Ser: 4.55 mg/dL — ABNORMAL HIGH (ref 0.61–1.24)
GFR calc Af Amer: 16 mL/min — ABNORMAL LOW (ref >60)
GFR calc non Af Amer: 14 mL/min — ABNORMAL LOW (ref >60)
Glucose, Bld: 299 mg/dL — ABNORMAL HIGH (ref 70–99)
Phosphorus: 6.4 mg/dL — ABNORMAL HIGH (ref 2.5–4.6)
Potassium: 5 mmol/L (ref 3.5–5.1)
Sodium: 134 mmol/L — ABNORMAL LOW (ref 135–145)

## 2019-12-10 LAB — CBC
HCT: 25 % — ABNORMAL LOW (ref 39.0–52.0)
Hemoglobin: 7.3 g/dL — ABNORMAL LOW (ref 13.0–17.0)
MCH: 24.7 pg — ABNORMAL LOW (ref 26.0–34.0)
MCHC: 29.2 g/dL — ABNORMAL LOW (ref 30.0–36.0)
MCV: 84.5 fL (ref 80.0–100.0)
Platelets: 188 10*3/uL (ref 150–400)
RBC: 2.96 MIL/uL — ABNORMAL LOW (ref 4.22–5.81)
RDW: 17.3 % — ABNORMAL HIGH (ref 11.5–15.5)
WBC: 8.6 10*3/uL (ref 4.0–10.5)
nRBC: 0.3 % — ABNORMAL HIGH (ref 0.0–0.2)

## 2019-12-10 LAB — CALCIUM, IONIZED: Calcium, Ionized, Serum: 4.2 mg/dL — ABNORMAL LOW (ref 4.5–5.6)

## 2019-12-10 MED ORDER — INSULIN ASPART 100 UNIT/ML ~~LOC~~ SOLN
0.0000 [IU] | Freq: Three times a day (TID) | SUBCUTANEOUS | Status: DC
  Administered 2019-12-10: 5 [IU] via SUBCUTANEOUS
  Administered 2019-12-11: 7 [IU] via SUBCUTANEOUS
  Administered 2019-12-11: 2 [IU] via SUBCUTANEOUS
  Administered 2019-12-11: 3 [IU] via SUBCUTANEOUS
  Administered 2019-12-13: 1 [IU] via SUBCUTANEOUS
  Administered 2019-12-13 – 2019-12-14 (×2): 2 [IU] via SUBCUTANEOUS
  Administered 2019-12-15 (×2): 1 [IU] via SUBCUTANEOUS
  Administered 2019-12-16: 2 [IU] via SUBCUTANEOUS

## 2019-12-10 MED ORDER — SODIUM CHLORIDE 0.9 % IV SOLN
2.0000 g | Freq: Two times a day (BID) | INTRAVENOUS | Status: DC
  Administered 2019-12-10 – 2019-12-15 (×10): 2 g via INTRAVENOUS
  Filled 2019-12-10 (×2): qty 2
  Filled 2019-12-10: qty 2000
  Filled 2019-12-10: qty 2
  Filled 2019-12-10: qty 2000
  Filled 2019-12-10 (×6): qty 2
  Filled 2019-12-10: qty 2000

## 2019-12-10 MED ORDER — INSULIN GLARGINE 100 UNIT/ML ~~LOC~~ SOLN
15.0000 [IU] | Freq: Every day | SUBCUTANEOUS | Status: DC
  Administered 2019-12-10: 15 [IU] via SUBCUTANEOUS
  Filled 2019-12-10 (×2): qty 0.15

## 2019-12-10 MED ORDER — INSULIN ASPART 100 UNIT/ML ~~LOC~~ SOLN
0.0000 [IU] | Freq: Every day | SUBCUTANEOUS | Status: DC
  Administered 2019-12-10: 3 [IU] via SUBCUTANEOUS
  Administered 2019-12-13: 2 [IU] via SUBCUTANEOUS

## 2019-12-10 MED ORDER — INSULIN ASPART 100 UNIT/ML ~~LOC~~ SOLN
4.0000 [IU] | Freq: Three times a day (TID) | SUBCUTANEOUS | Status: DC
  Administered 2019-12-10 – 2019-12-11 (×2): 4 [IU] via SUBCUTANEOUS

## 2019-12-10 NOTE — Progress Notes (Addendum)
Progress Note    12/10/2019 7:20 AM 1 Day Post-Op  Subjective:  POD 1 right BKA.  He is awake, alert with hand mitts in place. Asking to have mitts removed   Vitals:   12/10/19 0402 12/10/19 0413  BP: 110/68 110/68  Pulse: 92 91  Resp: 18 17  Temp: 98.7 F (37.1 C) 97.8 F (36.6 C)  SpO2: 98% 97%    Physical Exam: Cardiac:  RRR Lungs:  CTA B Incisions:  Right residual limb dressing dry and intact Extremities:  Left residual limb with eschar, no erythema Abdomen:  soft  CBC    Component Value Date/Time   WBC 8.6 12/10/2019 0300   RBC 2.96 (L) 12/10/2019 0300   HGB 7.3 (L) 12/10/2019 0300   HCT 25.0 (L) 12/10/2019 0300   HCT 27.6 (L) 12/06/2019 1358   PLT 188 12/10/2019 0300   MCV 84.5 12/10/2019 0300   MCH 24.7 (L) 12/10/2019 0300   MCHC 29.2 (L) 12/10/2019 0300   RDW 17.3 (H) 12/10/2019 0300   LYMPHSABS 1.1 12/08/2019 2315   MONOABS 0.7 12/08/2019 2315   EOSABS 0.0 12/08/2019 2315   BASOSABS 0.0 12/08/2019 2315    BMET    Component Value Date/Time   NA 134 (L) 12/10/2019 0300   K 5.0 12/10/2019 0300   CL 102 12/10/2019 0300   CO2 17 (L) 12/10/2019 0300   GLUCOSE 299 (H) 12/10/2019 0300   BUN 103 (H) 12/10/2019 0300   CREATININE 4.55 (H) 12/10/2019 0300   CALCIUM 7.5 (L) 12/10/2019 0300   CALCIUM 6.8 12/07/2019 1635   GFRNONAA 14 (L) 12/10/2019 0300   GFRAA 16 (L) 12/10/2019 0300     Intake/Output Summary (Last 24 hours) at 12/10/2019 0720 Last data filed at 12/10/2019 0600 Gross per 24 hour  Intake 1650 ml  Output 600 ml  Net 1050 ml    HOSPITAL MEDICATIONS Scheduled Meds: . clopidogrel  75 mg Oral Daily  . darbepoetin (ARANESP) injection - NON-DIALYSIS  150 mcg Subcutaneous Q Fri-1800  . docusate sodium  100 mg Oral Daily  . feeding supplement (ENSURE ENLIVE)  237 mL Oral BID BM  . furosemide  40 mg Oral BID  . insulin aspart  0-9 Units Subcutaneous Q4H  . insulin glargine  10 Units Subcutaneous QHS  . isosorbide mononitrate  30 mg  Oral Daily  . metoprolol succinate  25 mg Oral BID  . multivitamin  1 tablet Oral Daily  . nutrition supplement (JUVEN)  1 packet Oral BID BM  . pantoprazole  40 mg Oral Daily   Continuous Infusions: . cefTRIAXone (ROCEPHIN)  IV 2 g (12/09/19 1312)   And  . metronidazole Stopped (12/10/19 0513)  . magnesium sulfate bolus IVPB     PRN Meds:.acetaminophen **OR** acetaminophen, dextrose, magnesium sulfate bolus IVPB, morphine injection, ondansetron **OR** ondansetron (ZOFRAN) IV, oxyCODONE-acetaminophen, phenol, potassium chloride  Assessment:  48 y.o. male is s/p: right BKA due to critical limb ischemia of the right lower extremity with extensive diabetic foot infection and open wound as well as complex distal tibia-fibula ankle fracture   1 Day Post-Op  Plan: Follow-up intra-operative right LE cultures. Remove surgical dressing tomorrow Watch hemoglobin. ABX continue -DVT prophylaxis:  none   Risa Grill, PA-C [facility] [phone] 12/10/2019  7:20 AM   I have seen and evaluated the patient. I agree with the PA note as documented above. POD#1 s/p R BKA.  States pain improved.  Will remove surgical dressing tomorrow.  Marty Heck,  MD [facility] [place] Office: [phone] Pager: [phone]

## 2019-12-10 NOTE — Progress Notes (Signed)
Admit: 12/06/2019 LOS: 4  41M with nonhealing ankle wound after fracture s/p BKA 12/27; likely progressive CKD now advanced  Subjective:  . No new issues . -0.7L UOP yesterday . SCr stable, K 5.0 . BCx NGTD  12/27 0701 - 12/28 0700 In: [id number] [P.O.:300; I.V.:900; IV Piggyback:450] Out: 900 [Urine:700; Blood:200]  Filed Weights   12/08/19 0428 12/09/19 0435 12/10/19 0413  Weight: 104 kg 102.3 kg 101 kg    Scheduled Meds: . clopidogrel  75 mg Oral Daily  . darbepoetin (ARANESP) injection - NON-DIALYSIS  150 mcg Subcutaneous Q Fri-1800  . docusate sodium  100 mg Oral Daily  . feeding supplement (ENSURE ENLIVE)  237 mL Oral BID BM  . furosemide  40 mg Oral BID  . insulin aspart  0-9 Units Subcutaneous Q4H  . insulin glargine  15 Units Subcutaneous QHS  . isosorbide mononitrate  30 mg Oral Daily  . metoprolol succinate  25 mg Oral BID  . multivitamin  1 tablet Oral Daily  . nutrition supplement (JUVEN)  1 packet Oral BID BM  . pantoprazole  40 mg Oral Daily   Continuous Infusions: . cefTRIAXone (ROCEPHIN)  IV 2 g (12/09/19 1312)   And  . metronidazole Stopped (12/10/19 0513)  . magnesium sulfate bolus IVPB     PRN Meds:.acetaminophen **OR** acetaminophen, dextrose, magnesium sulfate bolus IVPB, morphine injection, ondansetron **OR** ondansetron (ZOFRAN) IV, oxyCODONE-acetaminophen, phenol, potassium chloride  Current Labs: reviewed  Results for Ernest, [name] (MRN [id number]) as of 12/09/2019 15:55  Ref. Range 12/08/2019 03:26  Saturation Ratios Latest Ref Range: 17.9 - 39.5 % 5 (L)  Ferritin Latest Ref Range: 24 - 336 ng/mL 1,180 (H)    Physical Exam:  Blood pressure 124/74, pulse 94, temperature (!) 97.4 F (36.3 C), temperature source Oral, resp. rate 18, height 5\' 9"  (1.753 m), weight 101 kg, SpO2 99 %. R BKA Stump bandaged, dressings clean Regular, nl rate, no rub, nl s1s2 CTAB S/nt/nd  A 1. 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  Stable GFR at  this time. K OK, no reason for HD at this point.  CTM  2. HTN/Vol; on lasix 40 BID, follow UOP.  BP stable 3. 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 Anemia: on ESA, got fereheme 12/26  P . As above, cont to trend labs . Daily weights, Daily Renal Panel, Strict I/Os, Avoid nephrotoxins (NSAIDs, judicious IV Contrast)    [name] MD 12/10/2019, 11:17 AM  Recent Labs  Lab 12/08/19 0326 12/08/19 2315 12/09/19 1118 12/10/19 0300  NA 138 132* 136 134*  K 4.3 4.5 4.6 5.0  CL 105 102 104 102  CO2 17* 15* 17* 17*  GLUCOSE 152* 246* 228* 299*  BUN 91* 93* 93* 103*  CREATININE 5.08* 4.79* 4.58* 4.55*  CALCIUM 7.0* 8.0* 7.7* 7.5*  PHOS 4.7*  --  6.3* 6.4*   Recent Labs  Lab 12/06/19 0933 12/06/19 1221 12/08/19 2315 12/09/19 1118 12/10/19 0300  WBC 8.2   < > 10.2 5.6 8.6  NEUTROABS 5.9  --  8.3*  --   --   HGB 8.5*  --  7.6* 7.5* 7.3*  HCT 29.0*   < > 25.1* 26.1* 25.0*  MCV 83.8   < > 82.3 86.4 84.5  PLT 225   < > 192 175 188   < > = values in this interval not displayed.
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
## 2019-12-10 NOTE — Progress Notes (Addendum)
Inpatient Diabetes Program Recommendations  AACE/ADA: New Consensus Statement on Inpatient Glycemic Control (2015)  Target Ranges:  Prepandial:   less than 140 mg/dL      Peak postprandial:   less than 180 mg/dL (1-2 hours)      Critically ill patients:  140 - 180 mg/dL   Lab Results  Component Value Date   GLUCAP 260 (H) 12/10/2019   HGBA1C 7.8 (H) 12/06/2019    Review of Glycemic Control   Results for Ernest [name], Ernest [name] (MRN [id number]) as of 12/10/2019 09:03  Ref. Range 12/09/2019 05:52 12/09/2019 09:24 12/09/2019 16:49 12/09/2019 19:31 12/10/2019 00:12 12/10/2019 04:12 12/10/2019 08:16  Glucose-Capillary Latest Ref Range: 70 - 99 mg/dL 189 (H)  NOVOLOG  4 units  189 (H) 252 (H)  NOVOLOG 5 units 286 (H)  NOVOLOG  5 units 330 (H)  NOVOLOG  7 units 244 (H)  NOVOLOG 3 units 260 (H)  NOVOLOG 5 units   Diabetes history: DM2 Outpatient Diabetes medications: Novolog 30 units TID with meals, Tradjenta 5 mg dailly, Lantus 40 units QHS (Lantus not on home med list but noted on PCP note on 11/13/19) Current orders for Inpatient glycemic control: Lantus 10 units daily; Novolog 0-9 Q4H; Decadron X1 on 12/27  Inpatient Diabetes Program Recommendations:  Noted that patient had 4mg  Decadron on 12/27 at 0800.  Since steroid administration and diet starting yesterday, CBG's have been elevated >200.    -Please consider Novolog 4 units TID with meals if eats at least 50% of meals -Novolog 0-9 TID with meals and 0-5 at bedtime  Thank you, Geoffry Paradise, RN, BSN Diabetes Coordinator Inpatient Diabetes Program [phone] (team pager from 8a-5p)
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
## 2019-12-10 NOTE — Progress Notes (Signed)
PT Cancellation Note  Patient Details Name: Ernest [name] MRN: [id number] DOB: [birth year]   Cancelled Treatment:    Reason Eval/Treat Not Completed: Patient declined, no reason specified Pt adamantly refusing therapy evaluations despite explanation on importance of participation. "I do not want any part of that." Likely will not participate, will attempt next visit.    Marguarite Arbour A Seanpatrick Maisano 12/10/2019, 9:17 AM Marisa Severin, PT, DPT Acute Rehabilitation Services Pager [phone] Office [phone]
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
## 2019-12-11 LAB — CBC
HCT: 24.8 % — ABNORMAL LOW (ref 39.0–52.0)
Hemoglobin: 7.1 g/dL — ABNORMAL LOW (ref 13.0–17.0)
MCH: 24.8 pg — ABNORMAL LOW (ref 26.0–34.0)
MCHC: 28.6 g/dL — ABNORMAL LOW (ref 30.0–36.0)
MCV: 86.7 fL (ref 80.0–100.0)
Platelets: 185 10*3/uL (ref 150–400)
RBC: 2.86 MIL/uL — ABNORMAL LOW (ref 4.22–5.81)
RDW: 17.8 % — ABNORMAL HIGH (ref 11.5–15.5)
WBC: 9.3 10*3/uL (ref 4.0–10.5)
nRBC: 0.6 % — ABNORMAL HIGH (ref 0.0–0.2)

## 2019-12-11 LAB — CULTURE, BLOOD (ROUTINE X 2)
Culture: NO GROWTH
Culture: NO GROWTH
Special Requests: ADEQUATE
Special Requests: ADEQUATE

## 2019-12-11 LAB — RENAL FUNCTION PANEL
Albumin: 2.2 g/dL — ABNORMAL LOW (ref 3.5–5.0)
Anion gap: 14 (ref 5–15)
BUN: 125 mg/dL — ABNORMAL HIGH (ref 6–20)
CO2: 20 mmol/L — ABNORMAL LOW (ref 22–32)
Calcium: 7.7 mg/dL — ABNORMAL LOW (ref 8.9–10.3)
Chloride: 102 mmol/L (ref 98–111)
Creatinine, Ser: 4.72 mg/dL — ABNORMAL HIGH (ref 0.61–1.24)
GFR calc Af Amer: 16 mL/min — ABNORMAL LOW (ref >60)
GFR calc non Af Amer: 13 mL/min — ABNORMAL LOW (ref >60)
Glucose, Bld: 330 mg/dL — ABNORMAL HIGH (ref 70–99)
Phosphorus: 5.9 mg/dL — ABNORMAL HIGH (ref 2.5–4.6)
Potassium: 5.3 mmol/L — ABNORMAL HIGH (ref 3.5–5.1)
Sodium: 136 mmol/L (ref 135–145)

## 2019-12-11 LAB — GLUCOSE, CAPILLARY
Glucose-Capillary: 314 mg/dL — ABNORMAL HIGH (ref 70–99)
Glucose-Capillary: 217 mg/dL — ABNORMAL HIGH (ref 70–99)
Glucose-Capillary: 196 mg/dL — ABNORMAL HIGH (ref 70–99)
Glucose-Capillary: 160 mg/dL — ABNORMAL HIGH (ref 70–99)

## 2019-12-11 LAB — PROTIME-INR
INR: 1.4 — ABNORMAL HIGH (ref 0.8–1.2)
Prothrombin Time: 17.4 seconds — ABNORMAL HIGH (ref 11.4–15.2)

## 2019-12-11 MED ORDER — INSULIN GLARGINE 100 UNIT/ML ~~LOC~~ SOLN
30.0000 [IU] | Freq: Every day | SUBCUTANEOUS | Status: DC
  Administered 2019-12-11 (×2): 30 [IU] via SUBCUTANEOUS
  Filled 2019-12-11 (×2): qty 0.3

## 2019-12-11 MED ORDER — ATORVASTATIN CALCIUM 10 MG PO TABS
10.0000 mg | ORAL_TABLET | Freq: Every day | ORAL | Status: DC
  Administered 2019-12-11 – 2019-12-15 (×5): 10 mg via ORAL
  Filled 2019-12-11 (×5): qty 1

## 2019-12-11 MED ORDER — AMLODIPINE BESYLATE 10 MG PO TABS
10.0000 mg | ORAL_TABLET | Freq: Every day | ORAL | Status: DC
  Administered 2019-12-11 – 2019-12-16 (×6): 10 mg via ORAL
  Filled 2019-12-11 (×6): qty 1

## 2019-12-11 MED ORDER — INSULIN ASPART 100 UNIT/ML ~~LOC~~ SOLN
6.0000 [IU] | Freq: Three times a day (TID) | SUBCUTANEOUS | Status: DC
  Administered 2019-12-12 (×2): 6 [IU] via SUBCUTANEOUS

## 2019-12-11 NOTE — Progress Notes (Signed)
Inpatient Diabetes Program Recommendations  AACE/ADA: New Consensus Statement on Inpatient Glycemic Control (2015)  Target Ranges:  Prepandial:   less than 140 mg/dL      Peak postprandial:   less than 180 mg/dL (1-2 hours)      Critically ill patients:  140 - 180 mg/dL   Lab Results  Component Value Date   GLUCAP 314 (H) 12/11/2019   HGBA1C 7.8 (H) 12/06/2019    Review of Glycemic Control  Results for SUYASH, SCHACK (MRN [id number]) as of 12/11/2019 08:50  Ref. Range 12/10/2019 08:16 12/10/2019 11:42 12/10/2019 16:49 12/10/2019 21:01 12/11/2019 06:03  Glucose-Capillary Latest Ref Range: 70 - 99 mg/dL 260 (H)  NOVOLOG 5units 268 (H)  NOVOLOG 5units 261 (H)  NOVOLOG 9units 295 (H)  NOVOLOG 3units + LANTUS 15 314 (H)  NOVOLOG 11units    Diabetes history: DM2 Outpatient Diabetes medications: Novolog 30 units TID; Trajenta 5 mg daily, Lantus 40 units Current orders for Inpatient glycemic control: Lantus 15 units daily, Novolog 0-9 TID and 0-5 units at bedtime; Novolog 4 units with meals  Inpatient Diabetes Program Recommendations:  Fasting 319mg /dl this am.  CBG's consistently >200  -Please consider increasing Lantus to 30 units daily & increasing Novolog  meal coverage 6 units TID with meals if eats at least 50%  Thank you, Geoffry Paradise, RN, BSN Diabetes Coordinator Inpatient Diabetes Program [phone] (team pager from 8a-5p)
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
## 2019-12-11 NOTE — Progress Notes (Signed)
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 No new issues, working with PT, no c/o . 0.6L UOP yesterday . SCr stable, K 5.3, BUN 125  . BCx NGF  12/28 0701 - 12/29 0700 In: 440 [P.O.:240; IV Piggyback:200] Out: 580 [Urine:580]  Filed Weights   12/09/19 0435 12/10/19 0413 12/11/19 0556  Weight: 102.3 kg 101 kg 103.8 kg    Scheduled Meds: . clopidogrel  75 mg Oral Daily  . darbepoetin (ARANESP) injection - NON-DIALYSIS  150 mcg Subcutaneous Q Fri-1800  . docusate sodium  100 mg Oral Daily  . feeding supplement (ENSURE ENLIVE)  237 mL Oral BID BM  . furosemide  40 mg Oral BID  . insulin aspart  0-5 Units Subcutaneous QHS  . insulin aspart  0-9 Units Subcutaneous TID WC  . insulin aspart  4 Units Subcutaneous TID WC  . insulin glargine  15 Units Subcutaneous QHS  . isosorbide mononitrate  30 mg Oral Daily  . metoprolol succinate  25 mg Oral BID  . multivitamin  1 tablet Oral Daily  . nutrition supplement (JUVEN)  1 packet Oral BID BM  . pantoprazole  40 mg Oral Daily   Continuous Infusions: . ampicillin (OMNIPEN) IV Stopped (12/11/19 0445)  . magnesium sulfate bolus IVPB     PRN Meds:.acetaminophen **OR** acetaminophen, dextrose, magnesium sulfate bolus IVPB, morphine injection, ondansetron **OR** ondansetron (ZOFRAN) IV, oxyCODONE-acetaminophen, phenol, potassium chloride  Current Labs: reviewed  Results for Ernest, [name] (MRN [id number]) as of 12/09/2019 15:55  Ref. Range 12/08/2019 03:26  Saturation Ratios Latest Ref Range: 17.9 - 39.5 % 5 (L)  Ferritin Latest Ref Range: 24 - 336 ng/mL 1,180 (H)    Physical Exam:  Blood pressure 120/70, pulse 79, temperature (!) 97.5 F (36.4 C), temperature source Oral, resp. rate 19, height 5\' 9"  (1.753 m), weight 103.8 kg, SpO2 95 %. R BKA Stump bandaged, dressings clean Regular, nl rate, no rub, nl s1s2 CTAB S/nt/nd  A 1. Likely  progressive CKD, likley diabetic nephropathy.  Stable GFR at this time but some worsenign azotemia.  K OK. No strong reason for HD at this point.  CTM and reassess tomorrow. 2. HTN/Vol; on lasix 40 BID, follow UOP.  BP stable 3. 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 Anemia: on ESA, got fereheme 12/26  P . As above, cont to trend labs . Worrisome that BUN is rising,  Will cont to monitor for uremia, might req HD during this admission . Daily weights, Daily Renal Panel, Strict I/Os, Avoid nephrotoxins (NSAIDs, judicious IV Contrast)    Ernest Grippe MD 12/11/2019, 12:26 PM  Recent Labs  Lab 12/09/19 1118 12/10/19 0300 12/11/19 0258  NA 136 134* 136  K 4.6 5.0 5.3*  CL 104 102 102  CO2 17* 17* 20*  GLUCOSE 228* 299* 330*  BUN 93* 103* 125*  CREATININE 4.58* 4.55* 4.72*  CALCIUM 7.7* 7.5* 7.7*  PHOS 6.3* 6.4* 5.9*   Recent Labs  Lab 12/06/19 0933 12/06/19 1221 12/08/19 2315 12/09/19 1118 12/10/19 0300 12/11/19 0258  WBC 8.2   < > 10.2 5.6 8.6 9.3  NEUTROABS 5.9  --  8.3*  --   --   --   HGB 8.5*  --  7.6* 7.5* 7.3* 7.1*  HCT 29.0*   < > 25.1* 26.1* 25.0* 24.8*  MCV 83.8   < > 82.3 86.4 84.5 86.7  PLT 225   < >  192 175 188 185   < > = values in this interval not displayed.
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
## 2019-12-11 NOTE — Progress Notes (Signed)
[facility]  Subjective  - States his rest pain is resolved since BKA.   Objective 121/79 79 (!) 97.5 F (36.4 C) (Oral) 19 95%  Intake/Output Summary (Last 24 hours) at 12/11/2019 0754 Last data filed at 12/11/2019 0609 Gross per 24 hour  Intake 440 ml  Output 580 ml  Net -140 ml    Right BKA dressing removed.  Right BKA looks clean dry and intact with staples.  Laboratory Lab Results: Recent Labs    12/10/19 0300 12/11/19 0258  WBC 8.6 9.3  HGB 7.3* 7.1*  HCT 25.0* 24.8*  PLT 188 185   BMET Recent Labs    12/10/19 0300 12/11/19 0258  NA 134* 136  K 5.0 5.3*  CL 102 102  CO2 17* 20*  GLUCOSE 299* 330*  BUN 103* 125*  CREATININE 4.55* 4.72*  CALCIUM 7.5* 7.7*    COAG Lab Results  Component Value Date   INR 1.4 (H) 12/11/2019   INR 1.4 (H) 12/10/2019   INR 1.5 (H) 12/09/2019   No results found for: PTT  Assessment/Planning:  Postop day 2 status post right BKA.  Dressing removed today and incision looks excellent with staples.  Can work toward placement.  Discussed with patient that we will leave staples for 3 weeks and will arrange follow-up in our clinic for staple removal.  Marty Heck 12/11/2019 7:54 AM --

## 2019-12-11 NOTE — Progress Notes (Signed)
  Progress Note    12/11/2019 7:41 AM 2 Days Post-Op  Subjective: Continues to complain of expected postoperative pain.  Discussed preliminary microbiology report yesterday with pharmacy.  Unasyn initiated  Vitals:   12/10/19 2103 12/11/19 0556  BP: 126/72 121/79  Pulse:    Resp: 17 19  Temp: 97.7 F (36.5 C) (!) 97.5 F (36.4 C)  SpO2: 100% 95%    Physical Exam: Cardiac:   Lungs: Nonlabored Incisions: Staple line intact.  Flaps warm and well perfused Extremities: Good right knee extension Abdomen:    Component 2 d ago  Specimen Description WOUND RIGHT LOWER LEG   Special Requests NONE   Gram Stain NO WBC SEEN  RARE GRAM POSITIVE COCCI IN PAIRS   Culture FEW ENTEROCOCCUS FAECALIS  SUSCEPTIBILITIES TO FOLLOW      CBC    Component Value Date/Time   WBC 9.3 12/11/2019 0258   RBC 2.86 (L) 12/11/2019 0258   HGB 7.1 (L) 12/11/2019 0258   HCT 24.8 (L) 12/11/2019 0258   HCT 27.6 (L) 12/06/2019 1358   PLT 185 12/11/2019 0258   MCV 86.7 12/11/2019 0258   MCH 24.8 (L) 12/11/2019 0258   MCHC 28.6 (L) 12/11/2019 0258   RDW 17.8 (H) 12/11/2019 0258   LYMPHSABS 1.1 12/08/2019 2315   MONOABS 0.7 12/08/2019 2315   EOSABS 0.0 12/08/2019 2315   BASOSABS 0.0 12/08/2019 2315    BMET    Component Value Date/Time   NA 136 12/11/2019 0258   K 5.3 (H) 12/11/2019 0258   CL 102 12/11/2019 0258   CO2 20 (L) 12/11/2019 0258   GLUCOSE 330 (H) 12/11/2019 0258   BUN 125 (H) 12/11/2019 0258   CREATININE 4.72 (H) 12/11/2019 0258   CALCIUM 7.7 (L) 12/11/2019 0258   CALCIUM 6.8 12/07/2019 1635   GFRNONAA 13 (L) 12/11/2019 0258   GFRAA 16 (L) 12/11/2019 0258     Intake/Output Summary (Last 24 hours) at 12/11/2019 0741 Last data filed at 12/11/2019 0609 Gross per 24 hour  Intake 440 ml  Output 580 ml  Net -140 ml    HOSPITAL MEDICATIONS Scheduled Meds: . clopidogrel  75 mg Oral Daily  . darbepoetin (ARANESP) injection - NON-DIALYSIS  150 mcg Subcutaneous Q Fri-1800    . docusate sodium  100 mg Oral Daily  . feeding supplement (ENSURE ENLIVE)  237 mL Oral BID BM  . furosemide  40 mg Oral BID  . insulin aspart  0-5 Units Subcutaneous QHS  . insulin aspart  0-9 Units Subcutaneous TID WC  . insulin aspart  4 Units Subcutaneous TID WC  . insulin glargine  15 Units Subcutaneous QHS  . isosorbide mononitrate  30 mg Oral Daily  . metoprolol succinate  25 mg Oral BID  . multivitamin  1 tablet Oral Daily  . nutrition supplement (JUVEN)  1 packet Oral BID BM  . pantoprazole  40 mg Oral Daily   Continuous Infusions: . ampicillin (OMNIPEN) IV Stopped (12/11/19 0445)  . magnesium sulfate bolus IVPB     PRN Meds:.acetaminophen **OR** acetaminophen, dextrose, magnesium sulfate bolus IVPB, morphine injection, ondansetron **OR** ondansetron (ZOFRAN) IV, oxyCODONE-acetaminophen, phenol, potassium chloride  Assessment:  48 y.o. male is s/p:  Right below the knee amputation.  2 Days Post-Op  Plan:Healing nicely.  Had evidence of purulent drainage at time of amputation and intraoperative cultures showed few enterococci.  Unasyn initiated.  Follow-up sensitivities      Risa Grill, PA-C [facility] [phone] 12/11/2019  7:41 AM

## 2019-12-12 LAB — RENAL FUNCTION PANEL
Albumin: 2.1 g/dL — ABNORMAL LOW (ref 3.5–5.0)
Anion gap: 12 (ref 5–15)
BUN: 134 mg/dL — ABNORMAL HIGH (ref 6–20)
CO2: 20 mmol/L — ABNORMAL LOW (ref 22–32)
Calcium: 7.7 mg/dL — ABNORMAL LOW (ref 8.9–10.3)
Chloride: 107 mmol/L (ref 98–111)
Creatinine, Ser: 4.19 mg/dL — ABNORMAL HIGH (ref 0.61–1.24)
GFR calc Af Amer: 18 mL/min — ABNORMAL LOW (ref >60)
GFR calc non Af Amer: 15 mL/min — ABNORMAL LOW (ref >60)
Glucose, Bld: 154 mg/dL — ABNORMAL HIGH (ref 70–99)
Phosphorus: 5 mg/dL — ABNORMAL HIGH (ref 2.5–4.6)
Potassium: 4.8 mmol/L (ref 3.5–5.1)
Sodium: 139 mmol/L (ref 135–145)

## 2019-12-12 LAB — GLUCOSE, CAPILLARY
Glucose-Capillary: 143 mg/dL — ABNORMAL HIGH (ref 70–99)
Glucose-Capillary: 105 mg/dL — ABNORMAL HIGH (ref 70–99)
Glucose-Capillary: 76 mg/dL (ref 70–99)
Glucose-Capillary: 110 mg/dL — ABNORMAL HIGH (ref 70–99)

## 2019-12-12 LAB — SURGICAL PATHOLOGY

## 2019-12-12 LAB — CBC
HCT: 26.7 % — ABNORMAL LOW (ref 39.0–52.0)
Hemoglobin: 7.5 g/dL — ABNORMAL LOW (ref 13.0–17.0)
MCH: 24.8 pg — ABNORMAL LOW (ref 26.0–34.0)
MCHC: 28.1 g/dL — ABNORMAL LOW (ref 30.0–36.0)
MCV: 88.4 fL (ref 80.0–100.0)
Platelets: 199 10*3/uL (ref 150–400)
RBC: 3.02 MIL/uL — ABNORMAL LOW (ref 4.22–5.81)
RDW: 18.2 % — ABNORMAL HIGH (ref 11.5–15.5)
WBC: 8.5 10*3/uL (ref 4.0–10.5)
nRBC: 1.4 % — ABNORMAL HIGH (ref 0.0–0.2)

## 2019-12-12 LAB — PROTIME-INR
INR: 1.4 — ABNORMAL HIGH (ref 0.8–1.2)
Prothrombin Time: 16.6 seconds — ABNORMAL HIGH (ref 11.4–15.2)

## 2019-12-12 MED ORDER — INSULIN GLARGINE 100 UNIT/ML ~~LOC~~ SOLN
25.0000 [IU] | Freq: Every day | SUBCUTANEOUS | Status: DC
  Administered 2019-12-12 – 2019-12-15 (×4): 25 [IU] via SUBCUTANEOUS
  Filled 2019-12-12 (×5): qty 0.25

## 2019-12-12 MED ORDER — INSULIN ASPART 100 UNIT/ML ~~LOC~~ SOLN
4.0000 [IU] | Freq: Three times a day (TID) | SUBCUTANEOUS | Status: DC
  Administered 2019-12-13 – 2019-12-16 (×6): 4 [IU] via SUBCUTANEOUS

## 2019-12-12 MED ORDER — ADULT MULTIVITAMIN W/MINERALS CH
1.0000 | ORAL_TABLET | Freq: Every day | ORAL | Status: DC
  Administered 2019-12-13 – 2019-12-16 (×4): 1 via ORAL
  Filled 2019-12-12 (×4): qty 1

## 2019-12-12 NOTE — Progress Notes (Signed)
Inpatient Diabetes Program Recommendations  AACE/ADA: New Consensus Statement on Inpatient Glycemic Control (2015)  Target Ranges:  Prepandial:   less than 140 mg/dL      Peak postprandial:   less than 180 mg/dL (1-2 hours)      Critically ill patients:  140 - 180 mg/dL   Lab Results  Component Value Date   GLUCAP 105 (H) 12/12/2019   HGBA1C 7.8 (H) 12/06/2019    Review of Glycemic Control Results for Ernest [name], Ernest [name] (MRN [id number]) as of 12/12/2019 16:20  Ref. Range 12/11/2019 12:38 12/11/2019 17:12 12/11/2019 21:26 12/12/2019 06:10 12/12/2019 11:19  Glucose-Capillary Latest Ref Range: 70 - 99 mg/dL 217 (H) 196 (H) 160 (H) 143 (H) 105 (H)   Diabetes history: DM 2 Outpatient Diabetes medications:  Novolog 30 units tid with meals Tradjenta 5 mg daily Current orders for Inpatient glycemic control:  Novolog sensitive tid with meals and HS Lantus 30 units q HS Novolog 6 units tid with meals  Inpatient Diabetes Program Recommendations:  Consider reducing Lantus to 25 units q HS.  Also consider reducing Novolog meal coverage to 4 units tid with meals (hold if patient eats less than 50%).   Thanks  Adah Perl, RN, BC-ADM Inpatient Diabetes Coordinator Pager [phone] (8a-5p)

## 2019-12-12 NOTE — Progress Notes (Signed)
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 No new issues, refusing CIR . 1L UOP yesterday . SCr improved this AM . K4.8, BUN still up  12/29 0701 - 12/30 0700 In: 360 [P.O.:360] Out: 1080 [Urine:1080]  Filed Weights   12/10/19 0413 12/11/19 0556 12/12/19 0514  Weight: 101 kg 103.8 kg 101.4 kg    Scheduled Meds: . amLODipine  10 mg Oral Daily  . atorvastatin  10 mg Oral QHS  . clopidogrel  75 mg Oral Daily  . darbepoetin (ARANESP) injection - NON-DIALYSIS  150 mcg Subcutaneous Q Fri-1800  . docusate sodium  100 mg Oral Daily  . feeding supplement (ENSURE ENLIVE)  237 mL Oral BID BM  . furosemide  40 mg Oral BID  . insulin aspart  0-5 Units Subcutaneous QHS  . insulin aspart  0-9 Units Subcutaneous TID WC  . insulin aspart  6 Units Subcutaneous TID WC  . insulin glargine  30 Units Subcutaneous QHS  . isosorbide mononitrate  30 mg Oral Daily  . metoprolol succinate  25 mg Oral BID  . multivitamin  1 tablet Oral Daily  . nutrition supplement (JUVEN)  1 packet Oral BID BM  . pantoprazole  40 mg Oral Daily   Continuous Infusions: . ampicillin (OMNIPEN) IV 2 g (12/12/19 0512)  . magnesium sulfate bolus IVPB     PRN Meds:.acetaminophen **OR** acetaminophen, dextrose, magnesium sulfate bolus IVPB, morphine injection, ondansetron **OR** ondansetron (ZOFRAN) IV, oxyCODONE-acetaminophen, phenol, potassium chloride  Current Labs: reviewed  Results for [place], WOLAVER (MRN [id number]) as of 12/09/2019 15:55  Ref. Range 12/08/2019 03:26  Saturation Ratios Latest Ref Range: 17.9 - 39.5 % 5 (L)  Ferritin Latest Ref Range: 24 - 336 ng/mL 1,180 (H)    Physical Exam:  Blood pressure 123/72, pulse 82, temperature (!) 97.5 F (36.4 C), temperature source Oral, resp. rate 18, height 5\' 9"  (1.753 m), weight 101.4 kg, SpO2 100 %. R BKA Stump bandaged, dressings clean Regular, nl rate, no rub,  nl s1s2 CTAB S/nt/nd  A 1. 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  Stable GFR at this time but sig azotemia  K OK. No strong reason for HD at this point and UOP and downtrending SCr are encouraging.  CTM and reassess tomorrow. 2. HTN/Vol; on lasix 40 BID, follow UOP.  BP stable 3. 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 Anemia: on ESA, got fereheme 12/26  P . As above, cont to trend labs . maybe will avoid HD in immediate future . Daily weights, Daily Renal Panel, Strict I/Os, Avoid nephrotoxins (NSAIDs, judicious IV Contrast)    [name] MD 12/12/2019, 12:49 PM  Recent Labs  Lab 12/10/19 0300 12/11/19 0258 12/12/19 0315  NA 134* 136 139  K 5.0 5.3* 4.8  CL 102 102 107  CO2 17* 20* 20*  GLUCOSE 299* 330* 154*  BUN 103* 125* 134*  CREATININE 4.55* 4.72* 4.19*  CALCIUM 7.5* 7.7* 7.7*  PHOS 6.4* 5.9* 5.0*   Recent Labs  Lab 12/06/19 0933 12/06/19 1221 12/08/19 2315 12/10/19 0300 12/11/19 0258 12/12/19 0315  WBC 8.2   < > 10.2 8.6 9.3 8.5  NEUTROABS 5.9  --  8.3*  --   --   --   HGB 8.5*  --  7.6* 7.3* 7.1* 7.5*  HCT 29.0*   < > 25.1* 25.0* 24.8* 26.7*  MCV 83.8   < > 82.3 84.5 86.7  88.4  PLT 225   < > 192 188 185 199   < > = values in this interval not displayed.

## 2019-12-12 NOTE — Progress Notes (Signed)
Inpatient Rehab Admissions:  Inpatient Rehab Consult received.  I met with patient at the bedside for rehabilitation assessment and to discuss goals and expectations of an inpatient rehab admission.  He is not interested in any rehab program where he needs to "stay longer".  We discussed pt living alone and likely needing some help at discharge and he states he has family that will be able to help.  He would not elaborate any further, and no family listed in chart.  Will sign off at this time. I let CM know.    Signed: Shann Medal, PT, DPT Admissions Coordinator [phone] 12/12/19  11:52 AM
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
## 2019-12-12 NOTE — Progress Notes (Addendum)
Progress Note    12/12/2019 7:08 AM 3 Days Post-Op  Subjective: Status post right BKA. Didn't sleep well otherwise no complaints  He remains afebrile with stable VS  Vitals:   12/11/19 2057 12/12/19 0339  BP:  114/70  Pulse: 78 78  Resp:    Temp:  (!) 97.5 F (36.4 C)  SpO2: 100% 100%    Physical Exam: Cardiac:  RRR Lungs:  CTA B Incisions: Right BKA incision is well approximated without drainage.  His flaps are warm and well perfused.  Excellent knee extension. Left residual limb: Eschar is softening up.  There is no periwound erythema or tenderness to palpation. CBC    Component Value Date/Time   WBC 8.5 12/12/2019 0315   RBC 3.02 (L) 12/12/2019 0315   HGB 7.5 (L) 12/12/2019 0315   HCT 26.7 (L) 12/12/2019 0315   HCT 27.6 (L) 12/06/2019 1358   PLT 199 12/12/2019 0315   MCV 88.4 12/12/2019 0315   MCH 24.8 (L) 12/12/2019 0315   MCHC 28.1 (L) 12/12/2019 0315   RDW 18.2 (H) 12/12/2019 0315   LYMPHSABS 1.1 12/08/2019 2315   MONOABS 0.7 12/08/2019 2315   EOSABS 0.0 12/08/2019 2315   BASOSABS 0.0 12/08/2019 2315    BMET    Component Value Date/Time   NA 139 12/12/2019 0315   K 4.8 12/12/2019 0315   CL 107 12/12/2019 0315   CO2 20 (L) 12/12/2019 0315   GLUCOSE 154 (H) 12/12/2019 0315   BUN 134 (H) 12/12/2019 0315   CREATININE 4.19 (H) 12/12/2019 0315   CALCIUM 7.7 (L) 12/12/2019 0315   CALCIUM 6.8 12/07/2019 1635   GFRNONAA 15 (L) 12/12/2019 0315   GFRAA 18 (L) 12/12/2019 0315     Intake/Output Summary (Last 24 hours) at 12/12/2019 0708 Last data filed at 12/12/2019 0300 Gross per 24 hour  Intake 360 ml  Output 1080 ml  Net -720 ml    HOSPITAL MEDICATIONS Scheduled Meds: . amLODipine  10 mg Oral Daily  . atorvastatin  10 mg Oral QHS  . clopidogrel  75 mg Oral Daily  . darbepoetin (ARANESP) injection - NON-DIALYSIS  150 mcg Subcutaneous Q Fri-1800  . docusate sodium  100 mg Oral Daily  . feeding supplement (ENSURE ENLIVE)  237 mL Oral BID BM  .  furosemide  40 mg Oral BID  . insulin aspart  0-5 Units Subcutaneous QHS  . insulin aspart  0-9 Units Subcutaneous TID WC  . insulin aspart  6 Units Subcutaneous TID WC  . insulin glargine  30 Units Subcutaneous QHS  . isosorbide mononitrate  30 mg Oral Daily  . metoprolol succinate  25 mg Oral BID  . multivitamin  1 tablet Oral Daily  . nutrition supplement (JUVEN)  1 packet Oral BID BM  . pantoprazole  40 mg Oral Daily   Continuous Infusions: . ampicillin (OMNIPEN) IV 2 g (12/12/19 0512)  . magnesium sulfate bolus IVPB     PRN Meds:.acetaminophen **OR** acetaminophen, dextrose, magnesium sulfate bolus IVPB, morphine injection, ondansetron **OR** ondansetron (ZOFRAN) IV, oxyCODONE-acetaminophen, phenol, potassium chloride  Assessment:  48 y.o. male is s/p:  Right below the knee amputation.  Intraoperative cultures positive for Enterococcus sensitive to ampicillin.  Remains afebrile with normal white blood cell count 3 Days Post-Op  Plan: -Continue antibiotics.  Continue to monitor incision.  Continue to monitor left residual limb eschar.    Risa Grill, PA-C [facility] [phone] 12/12/2019  7:08 AM   I have seen and evaluated the  patient. I agree with the PA note as documented above.  Postop day 2 status post right below-knee amputation.  Below-knee amputation continues to look good.  I would continue antibiotics given OR cultures.  Marty Heck, MD [facility] [place] Office: [phone] Pager: [phone]

## 2019-12-13 LAB — CBC
HCT: 28.9 % — ABNORMAL LOW (ref 39.0–52.0)
Hemoglobin: 8.2 g/dL — ABNORMAL LOW (ref 13.0–17.0)
MCH: 24.8 pg — ABNORMAL LOW (ref 26.0–34.0)
MCHC: 28.4 g/dL — ABNORMAL LOW (ref 30.0–36.0)
MCV: 87.3 fL (ref 80.0–100.0)
Platelets: 227 10*3/uL (ref 150–400)
RBC: 3.31 MIL/uL — ABNORMAL LOW (ref 4.22–5.81)
RDW: 18.9 % — ABNORMAL HIGH (ref 11.5–15.5)
WBC: 8.7 10*3/uL (ref 4.0–10.5)
nRBC: 1 % — ABNORMAL HIGH (ref 0.0–0.2)

## 2019-12-13 LAB — COMPREHENSIVE METABOLIC PANEL
ALT: 7 U/L (ref 0–44)
AST: 16 U/L (ref 15–41)
Albumin: 2.1 g/dL — ABNORMAL LOW (ref 3.5–5.0)
Alkaline Phosphatase: 90 U/L (ref 38–126)
Anion gap: 14 (ref 5–15)
BUN: 137 mg/dL — ABNORMAL HIGH (ref 6–20)
CO2: 20 mmol/L — ABNORMAL LOW (ref 22–32)
Calcium: 8 mg/dL — ABNORMAL LOW (ref 8.9–10.3)
Chloride: 104 mmol/L (ref 98–111)
Creatinine, Ser: 3.92 mg/dL — ABNORMAL HIGH (ref 0.61–1.24)
GFR calc Af Amer: 19 mL/min — ABNORMAL LOW (ref >60)
GFR calc non Af Amer: 17 mL/min — ABNORMAL LOW (ref >60)
Glucose, Bld: 167 mg/dL — ABNORMAL HIGH (ref 70–99)
Potassium: 4.7 mmol/L (ref 3.5–5.1)
Sodium: 138 mmol/L (ref 135–145)
Total Bilirubin: 0.8 mg/dL (ref 0.3–1.2)
Total Protein: 6.6 g/dL (ref 6.5–8.1)

## 2019-12-13 LAB — GLUCOSE, CAPILLARY
Glucose-Capillary: 138 mg/dL — ABNORMAL HIGH (ref 70–99)
Glucose-Capillary: 152 mg/dL — ABNORMAL HIGH (ref 70–99)
Glucose-Capillary: 135 mg/dL — ABNORMAL HIGH (ref 70–99)
Glucose-Capillary: 119 mg/dL — ABNORMAL HIGH (ref 70–99)
Glucose-Capillary: 222 mg/dL — ABNORMAL HIGH (ref 70–99)

## 2019-12-13 LAB — PHOSPHORUS: Phosphorus: 5.2 mg/dL — ABNORMAL HIGH (ref 2.5–4.6)

## 2019-12-13 LAB — PROTIME-INR
INR: 1.4 — ABNORMAL HIGH (ref 0.8–1.2)
Prothrombin Time: 17.3 seconds — ABNORMAL HIGH (ref 11.4–15.2)

## 2019-12-13 LAB — MAGNESIUM: Magnesium: 1.9 mg/dL (ref 1.7–2.4)
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
## 2019-12-13 NOTE — Progress Notes (Addendum)
Progress Note    12/13/2019 7:12 AM 4 Days Post-Op  Subjective:  Denies pain this morning    Vitals:   12/13/19 0414 12/13/19 0415  BP: 128/74 128/74  Pulse: 87 86  Resp:    Temp:  98.3 F (36.8 C)  SpO2: 100% 99%    Physical Exam: Cardiac:  RRR Lungs:  CTA B Incisions:  Right residual limb incision intact. Flaps warm. No sign of infection Extremities:  Left limb eschar softening. No purulence or drainage Abdomen:  Soft, NT  Susceptibility   Enterococcus faecalis    MIC    AMPICILLIN <=2 SENSITIVE  Sensitive    GENTAMICIN SYNERGY RESISTANT  Resistant    VANCOMYCIN 1 SENSITIVE  Sensitive      CBC    Component Value Date/Time   WBC 8.7 12/13/2019 0318   RBC 3.31 (L) 12/13/2019 0318   HGB 8.2 (L) 12/13/2019 0318   HCT 28.9 (L) 12/13/2019 0318   HCT 27.6 (L) 12/06/2019 1358   PLT 227 12/13/2019 0318   MCV 87.3 12/13/2019 0318   MCH 24.8 (L) 12/13/2019 0318   MCHC 28.4 (L) 12/13/2019 0318   RDW 18.9 (H) 12/13/2019 0318   LYMPHSABS 1.1 12/08/2019 2315   MONOABS 0.7 12/08/2019 2315   EOSABS 0.0 12/08/2019 2315   BASOSABS 0.0 12/08/2019 2315    BMET    Component Value Date/Time   NA 138 12/13/2019 0318   K 4.7 12/13/2019 0318   CL 104 12/13/2019 0318   CO2 20 (L) 12/13/2019 0318   GLUCOSE 167 (H) 12/13/2019 0318   BUN 137 (H) 12/13/2019 0318   CREATININE 3.92 (H) 12/13/2019 0318   CALCIUM 8.0 (L) 12/13/2019 0318   CALCIUM 6.8 12/07/2019 1635   GFRNONAA 17 (L) 12/13/2019 0318   GFRAA 19 (L) 12/13/2019 0318     Intake/Output Summary (Last 24 hours) at 12/13/2019 [id number] Last data filed at 12/13/2019 0410 Gross per 24 hour  Intake 490 ml  Output 1390 ml  Net -900 ml    HOSPITAL MEDICATIONS Scheduled Meds: . amLODipine  10 mg Oral Daily  . atorvastatin  10 mg Oral QHS  . clopidogrel  75 mg Oral Daily  . darbepoetin (ARANESP) injection - NON-DIALYSIS  150 mcg Subcutaneous Q Fri-1800  . docusate sodium  100 mg Oral Daily  . feeding supplement  (ENSURE ENLIVE)  237 mL Oral BID BM  . furosemide  40 mg Oral BID  . insulin aspart  0-5 Units Subcutaneous QHS  . insulin aspart  0-9 Units Subcutaneous TID WC  . insulin aspart  4 Units Subcutaneous TID WC  . insulin glargine  25 Units Subcutaneous QHS  . isosorbide mononitrate  30 mg Oral Daily  . metoprolol succinate  25 mg Oral BID  . multivitamin with minerals  1 tablet Oral Daily  . nutrition supplement (JUVEN)  1 packet Oral BID BM  . pantoprazole  40 mg Oral Daily   Continuous Infusions: . ampicillin (OMNIPEN) IV 2 g (12/13/19 0410)  . magnesium sulfate bolus IVPB     PRN Meds:.acetaminophen **OR** acetaminophen, dextrose, magnesium sulfate bolus IVPB, morphine injection, ondansetron **OR** ondansetron (ZOFRAN) IV, oxyCODONE-acetaminophen, phenol, potassium chloride  Assessment:  48 y.o. male is s/p: right BKA. Purulence noted at time of surgery. Cultures positive for Enterococcus sensitive to Ampicillin.  Previous left BKA with pressure ulcer and overlying eschar.  Afebrile. WBC 8.7  On Plavix    4 Days Post-Op  Plan: -Continue local wound care and antibiotics  Risa Grill, PA-C [facility] [phone] 12/13/2019  7:12 AM   I have seen and evaluated the patient. I agree with the PA note as documented above.  POD#4 s/p R BKA for CLI with tissue loss and extensive infection in the foot.  Right below-knee amputation continues to look good.  Discussed with patient that we will arrange follow-up in 3 weeks for staple removal.  We will need to address the eschar on his left BKA in the near future, but I was going to allow him to recover from surgery on his right leg first.  Call the vascular surgeon on call this weekend if questions or concerns.  Marty Heck, MD [facility] [place] Office: [phone]
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
## 2019-12-14 ENCOUNTER — Encounter (HOSPITAL_COMMUNITY): Payer: Self-pay | Admitting: Internal Medicine

## 2019-12-14 LAB — COMPREHENSIVE METABOLIC PANEL
ALT: 6 U/L (ref 0–44)
AST: 13 U/L — ABNORMAL LOW (ref 15–41)
Albumin: 2 g/dL — ABNORMAL LOW (ref 3.5–5.0)
Alkaline Phosphatase: 75 U/L (ref 38–126)
Anion gap: 12 (ref 5–15)
BUN: 138 mg/dL — ABNORMAL HIGH (ref 6–20)
CO2: 23 mmol/L (ref 22–32)
Calcium: 7.9 mg/dL — ABNORMAL LOW (ref 8.9–10.3)
Chloride: 108 mmol/L (ref 98–111)
Creatinine, Ser: 3.42 mg/dL — ABNORMAL HIGH (ref 0.61–1.24)
GFR calc Af Amer: 23 mL/min — ABNORMAL LOW (ref >60)
GFR calc non Af Amer: 20 mL/min — ABNORMAL LOW (ref >60)
Glucose, Bld: 119 mg/dL — ABNORMAL HIGH (ref 70–99)
Potassium: 4.7 mmol/L (ref 3.5–5.1)
Sodium: 143 mmol/L (ref 135–145)
Total Bilirubin: 0.6 mg/dL (ref 0.3–1.2)
Total Protein: 6.3 g/dL — ABNORMAL LOW (ref 6.5–8.1)

## 2019-12-14 LAB — AEROBIC/ANAEROBIC CULTURE W GRAM STAIN (SURGICAL/DEEP WOUND): Gram Stain: NONE SEEN

## 2019-12-14 LAB — PROTIME-INR
INR: 1.5 — ABNORMAL HIGH (ref 0.8–1.2)
Prothrombin Time: 18 seconds — ABNORMAL HIGH (ref 11.4–15.2)

## 2019-12-14 LAB — CBC
HCT: 29.3 % — ABNORMAL LOW (ref 39.0–52.0)
Hemoglobin: 8.2 g/dL — ABNORMAL LOW (ref 13.0–17.0)
MCH: 24.7 pg — ABNORMAL LOW (ref 26.0–34.0)
MCHC: 28 g/dL — ABNORMAL LOW (ref 30.0–36.0)
MCV: 88.3 fL (ref 80.0–100.0)
Platelets: 233 10*3/uL (ref 150–400)
RBC: 3.32 MIL/uL — ABNORMAL LOW (ref 4.22–5.81)
RDW: 19.3 % — ABNORMAL HIGH (ref 11.5–15.5)
WBC: 8 10*3/uL (ref 4.0–10.5)
nRBC: 0.6 % — ABNORMAL HIGH (ref 0.0–0.2)

## 2019-12-14 LAB — GLUCOSE, CAPILLARY
Glucose-Capillary: 114 mg/dL — ABNORMAL HIGH (ref 70–99)
Glucose-Capillary: 82 mg/dL (ref 70–99)
Glucose-Capillary: 163 mg/dL — ABNORMAL HIGH (ref 70–99)
Glucose-Capillary: 172 mg/dL — ABNORMAL HIGH (ref 70–99)

## 2019-12-14 LAB — MAGNESIUM: Magnesium: 2 mg/dL (ref 1.7–2.4)

## 2019-12-14 LAB — PHOSPHORUS: Phosphorus: 4.7 mg/dL — ABNORMAL HIGH (ref 2.5–4.6)

## 2019-12-14 MED ORDER — SODIUM CHLORIDE 0.9 % IV SOLN: INTRAVENOUS | Status: AC

## 2019-12-14 MED ORDER — OXYCODONE-ACETAMINOPHEN 5-325 MG PO TABS
1.0000 | ORAL_TABLET | ORAL | Status: DC | PRN
  Administered 2019-12-14 – 2019-12-16 (×4): 1 via ORAL
  Filled 2019-12-14 (×5): qty 1

## 2019-12-14 MED ORDER — MORPHINE SULFATE (PF) 2 MG/ML IV SOLN
2.0000 mg | INTRAVENOUS | Status: DC | PRN
  Administered 2019-12-15: 2 mg via INTRAVENOUS
  Filled 2019-12-14: qty 1

## 2019-12-14 MED ORDER — HEPARIN SODIUM (PORCINE) 5000 UNIT/ML IJ SOLN
5000.0000 [IU] | Freq: Three times a day (TID) | INTRAMUSCULAR | Status: DC
  Administered 2019-12-14 – 2019-12-16 (×6): 5000 [IU] via SUBCUTANEOUS
  Filled 2019-12-14 (×5): qty 1

## 2019-12-14 NOTE — Progress Notes (Signed)
PT Cancellation Note  Patient Details Name: Macklyn Parpart MRN: [id number] DOB: August 28, 1971   Cancelled Treatment:    Reason Eval/Treat Not Completed: Patient declined, no reason specified. PT attempted to see pt for PT session however pt declining, reporting his mind is set and he is not moving right now. Pt declines possibility of PT returning later in the day. When asked how the pt would ascend steps to get into house he reports "i'll figure it out". PT will continue to follow per PT POC, however if pt continues to refuse then PT will sign off.   Zenaida Niece 12/14/2019, 9:33 AM

## 2019-12-14 NOTE — Progress Notes (Signed)
CSW notes if patient should return home today, PTAR forms on chart.   RN to call Corey Harold [phone] (option 1 then option 3) for transport.   Messaged MD will need home health order for [facility] to resume services (currently active with [facility]).  [place], Mark

## 2019-12-14 NOTE — Progress Notes (Signed)
[facility] Progress Note    Assessment/ Plan:   1. 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  Stable GFR at this time but sig azotemia  K OK. No strong reason for HD, UOP and downtrending SCr are encouraging.  ? If azotemia is related to intravascular vol depletion.  Will hold PM dose of Lasix today and do a time-limited gentle trial of IVFs x 12 hrs to see if that improves azotemia any.  He could be catabolic from diabetic wounds as well which could explain BUN out of proportion to Cr. 2. HTN/Vol; oas above.  BP stable 3. 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 Anemia: on ESA, got fereheme 12/26  Subjective:    Seen in room. Still with sig azotemia--> Cr improving, BUN still up at 138.  Pt sleeping this AM, says things not appropriate to the situation but in an of themselves are logical.  Does report dry mouth.     Objective:   BP 135/72 (BP Location: Right Arm)   Pulse 89   Temp 98 F (36.7 C) (Oral)   Resp 18   Ht 5\' 9"  (1.753 m)   Wt 100.1 kg   SpO2 98%   BMI 32.59 kg/m   Intake/Output Summary (Last 24 hours) at 12/14/2019 1314 Last data filed at 12/14/2019 [id number] Gross per 24 hour  Intake 480 ml  Output 1575 ml  Net -1095 ml   Weight change: 0.399 kg  Physical Exam: Gen: sleeping, easily arousable CVS: RRR Resp: clear Abd: obese Ext: no LE edema, L diabetic stump wound, dressed Neuro: AAO x 3, inappropriate statements, no asterixis  Imaging: No results found.  Labs: BMET Recent Labs  Lab 12/08/19 0326 12/08/19 2315 12/09/19 1118 12/10/19 0300 12/11/19 0258 12/12/19 0315 12/13/19 0318 12/14/19 0416 12/14/19 0417  NA 138 132* 136 134* 136 139 138 143  --   K 4.3 4.5 4.6 5.0 5.3* 4.8 4.7 4.7  --   CL 105 102 104 102 102 107 104 108  --   CO2 17* 15* 17* 17* 20* 20* 20* 23  --   GLUCOSE 152* 246* 228* 299* 330* 154* 167* 119*  --   BUN 91* 93* 93* 103* 125* 134* 137* 138*  --   CREATININE 5.08* 4.79* 4.58* 4.55* 4.72* 4.19* 3.92*  3.42*  --   CALCIUM 7.0* 8.0* 7.7* 7.5* 7.7* 7.7* 8.0* 7.9*  --   PHOS 4.7*  --  6.3* 6.4* 5.9* 5.0* 5.2*  --  4.7*   CBC Recent Labs  Lab 12/08/19 2315 12/11/19 0258 12/12/19 0315 12/13/19 0318 12/14/19 0416  WBC 10.2 9.3 8.5 8.7 8.0  NEUTROABS 8.3*  --   --   --   --   HGB 7.6* 7.1* 7.5* 8.2* 8.2*  HCT 25.1* 24.8* 26.7* 28.9* 29.3*  MCV 82.3 86.7 88.4 87.3 88.3  PLT 192 185 199 227 233    Medications:    . amLODipine  10 mg Oral Daily  . atorvastatin  10 mg Oral QHS  . clopidogrel  75 mg Oral Daily  . darbepoetin (ARANESP) injection - NON-DIALYSIS  150 mcg Subcutaneous Q Fri-1800  . docusate sodium  100 mg Oral Daily  . feeding supplement (ENSURE ENLIVE)  237 mL Oral BID BM  . heparin injection (subcutaneous)  5,000 Units Subcutaneous Q8H  . insulin aspart  0-5 Units Subcutaneous QHS  . insulin aspart  0-9 Units Subcutaneous TID WC  . insulin aspart  4 Units Subcutaneous TID WC  .  insulin glargine  25 Units Subcutaneous QHS  . isosorbide mononitrate  30 mg Oral Daily  . metoprolol succinate  25 mg Oral BID  . multivitamin with minerals  1 tablet Oral Daily  . nutrition supplement (JUVEN)  1 packet Oral BID BM  . pantoprazole  40 mg Oral Daily      Madelon Lips, MD 12/14/2019, 1:14 PM
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
## 2019-12-15 ENCOUNTER — Inpatient Hospital Stay (HOSPITAL_COMMUNITY): Payer: Medicare PPO

## 2019-12-15 LAB — CBC
HCT: 28.3 % — ABNORMAL LOW (ref 39.0–52.0)
HCT: 28.6 % — ABNORMAL LOW (ref 39.0–52.0)
Hemoglobin: 8.2 g/dL — ABNORMAL LOW (ref 13.0–17.0)
Hemoglobin: 8.2 g/dL — ABNORMAL LOW (ref 13.0–17.0)
MCH: 25.2 pg — ABNORMAL LOW (ref 26.0–34.0)
MCH: 24.8 pg — ABNORMAL LOW (ref 26.0–34.0)
MCHC: 29 g/dL — ABNORMAL LOW (ref 30.0–36.0)
MCHC: 28.7 g/dL — ABNORMAL LOW (ref 30.0–36.0)
MCV: 87.1 fL (ref 80.0–100.0)
MCV: 86.4 fL (ref 80.0–100.0)
Platelets: 236 10*3/uL (ref 150–400)
Platelets: 234 10*3/uL (ref 150–400)
RBC: 3.25 MIL/uL — ABNORMAL LOW (ref 4.22–5.81)
RBC: 3.31 MIL/uL — ABNORMAL LOW (ref 4.22–5.81)
RDW: 19.6 % — ABNORMAL HIGH (ref 11.5–15.5)
RDW: 19.9 % — ABNORMAL HIGH (ref 11.5–15.5)
WBC: 9.3 10*3/uL (ref 4.0–10.5)
WBC: 10 10*3/uL (ref 4.0–10.5)
nRBC: 0 % (ref 0.0–0.2)
nRBC: 0 % (ref 0.0–0.2)

## 2019-12-15 LAB — COMPREHENSIVE METABOLIC PANEL
ALT: 8 U/L (ref 0–44)
AST: 16 U/L (ref 15–41)
Albumin: 2 g/dL — ABNORMAL LOW (ref 3.5–5.0)
Alkaline Phosphatase: 86 U/L (ref 38–126)
Anion gap: 11 (ref 5–15)
BUN: 126 mg/dL — ABNORMAL HIGH (ref 6–20)
CO2: 22 mmol/L (ref 22–32)
Calcium: 7.7 mg/dL — ABNORMAL LOW (ref 8.9–10.3)
Chloride: 105 mmol/L (ref 98–111)
Creatinine, Ser: 2.9 mg/dL — ABNORMAL HIGH (ref 0.61–1.24)
GFR calc Af Amer: 28 mL/min — ABNORMAL LOW (ref >60)
GFR calc non Af Amer: 24 mL/min — ABNORMAL LOW (ref >60)
Glucose, Bld: 147 mg/dL — ABNORMAL HIGH (ref 70–99)
Potassium: 4.4 mmol/L (ref 3.5–5.1)
Sodium: 138 mmol/L (ref 135–145)
Total Bilirubin: 0.6 mg/dL (ref 0.3–1.2)
Total Protein: 6.1 g/dL — ABNORMAL LOW (ref 6.5–8.1)

## 2019-12-15 LAB — GLUCOSE, CAPILLARY
Glucose-Capillary: 105 mg/dL — ABNORMAL HIGH (ref 70–99)
Glucose-Capillary: 123 mg/dL — ABNORMAL HIGH (ref 70–99)
Glucose-Capillary: 146 mg/dL — ABNORMAL HIGH (ref 70–99)
Glucose-Capillary: 155 mg/dL — ABNORMAL HIGH (ref 70–99)

## 2019-12-15 LAB — MAGNESIUM: Magnesium: 1.8 mg/dL (ref 1.7–2.4)

## 2019-12-15 LAB — PROTIME-INR
INR: 1.5 — ABNORMAL HIGH (ref 0.8–1.2)
Prothrombin Time: 17.7 seconds — ABNORMAL HIGH (ref 11.4–15.2)

## 2019-12-15 LAB — PHOSPHORUS: Phosphorus: 4.4 mg/dL (ref 2.5–4.6)

## 2019-12-15 MED ORDER — SODIUM CHLORIDE 0.9 % IV SOLN
2.0000 g | Freq: Three times a day (TID) | INTRAVENOUS | Status: DC
  Administered 2019-12-15 – 2019-12-16 (×4): 2 g via INTRAVENOUS
  Filled 2019-12-15 (×2): qty 2000
  Filled 2019-12-15 (×4): qty 2

## 2019-12-15 MED ORDER — SODIUM CHLORIDE 0.9 % IV SOLN: INTRAVENOUS | Status: AC

## 2019-12-15 MED ORDER — DARBEPOETIN ALFA 150 MCG/0.3ML IJ SOSY
150.0000 ug | PREFILLED_SYRINGE | Freq: Once | INTRAMUSCULAR | Status: AC
  Administered 2019-12-15: 150 ug via SUBCUTANEOUS
  Filled 2019-12-15: qty 0.3

## 2019-12-15 MED ORDER — FUROSEMIDE 10 MG/ML IJ SOLN
20.0000 mg | Freq: Once | INTRAMUSCULAR | Status: AC
  Administered 2019-12-15: 22:00:00 20 mg via INTRAVENOUS
  Filled 2019-12-15: qty 2

## 2019-12-15 MED ORDER — IPRATROPIUM-ALBUTEROL 0.5-2.5 (3) MG/3ML IN SOLN
3.0000 mL | Freq: Four times a day (QID) | RESPIRATORY_TRACT | Status: DC | PRN
  Administered 2019-12-15: 3 mL via RESPIRATORY_TRACT
  Filled 2019-12-15: qty 3

## 2019-12-15 NOTE — Progress Notes (Signed)
[facility] Progress Note    Assessment/ Plan:   1. 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  Stable GFR at this time but sig azotemia  K OK. No strong reason for HD, UOP and downtrending SCr are encouraging.  ? If azotemia is related to intravascular vol depletion.  Improved with IVFs--> will give a little more today, if further improvement tomorrow then could go home on reduced Lasix with close nephrology. 2. HTN/Vol; oas above.  BP stable 3. 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 Anemia: on ESA, got fereheme 12/26  Subjective:    BUN/Cr improved with IVFs.  Feels "rough" today.     Objective:   BP 137/73 (BP Location: Right Arm)   Pulse 86   Temp 98.1 F (36.7 C) (Oral)   Resp 17   Ht 5\' 9"  (1.753 m)   Wt 100.1 kg   SpO2 95%   BMI 32.59 kg/m   Intake/Output Summary (Last 24 hours) at 12/15/2019 1049 Last data filed at 12/15/2019 [id number] Gross per 24 hour  Intake 1150.41 ml  Output 1400 ml  Net -249.59 ml   Weight change:   Physical Exam: Gen: awake, in bed CVS: RRR Resp: clear Abd: obese Ext: no LE edema, L diabetic stump wound, dressed, s/p R BKA Neuro: AAO x 3, inappropriate statements, no asterixis  Imaging: No results found.  Labs: BMET Recent Labs  Lab 12/09/19 1118 12/10/19 0300 12/11/19 0258 12/12/19 0315 12/13/19 0318 12/14/19 0416 12/14/19 0417 12/15/19 0222  NA 136 134* 136 139 138 143  --  138  K 4.6 5.0 5.3* 4.8 4.7 4.7  --  4.4  CL 104 102 102 107 104 108  --  105  CO2 17* 17* 20* 20* 20* 23  --  22  GLUCOSE 228* 299* 330* 154* 167* 119*  --  147*  BUN 93* 103* 125* 134* 137* 138*  --  126*  CREATININE 4.58* 4.55* 4.72* 4.19* 3.92* 3.42*  --  2.90*  CALCIUM 7.7* 7.5* 7.7* 7.7* 8.0* 7.9*  --  7.7*  PHOS 6.3* 6.4* 5.9* 5.0* 5.2*  --  4.7* 4.4   CBC Recent Labs  Lab 12/08/19 2315 12/12/19 0315 12/13/19 0318 12/14/19 0416 12/15/19 0222  WBC 10.2 8.5 8.7 8.0 9.3  NEUTROABS 8.3*  --   --   --   --   HGB 7.6* 7.5*  8.2* 8.2* 8.2*  HCT 25.1* 26.7* 28.9* 29.3* 28.3*  MCV 82.3 88.4 87.3 88.3 87.1  PLT 192 199 227 233 236    Medications:    . amLODipine  10 mg Oral Daily  . atorvastatin  10 mg Oral QHS  . clopidogrel  75 mg Oral Daily  . darbepoetin (ARANESP) injection - NON-DIALYSIS  150 mcg Subcutaneous Q Fri-1800  . docusate sodium  100 mg Oral Daily  . feeding supplement (ENSURE ENLIVE)  237 mL Oral BID BM  . heparin injection (subcutaneous)  5,000 Units Subcutaneous Q8H  . insulin aspart  0-5 Units Subcutaneous QHS  . insulin aspart  0-9 Units Subcutaneous TID WC  . insulin aspart  4 Units Subcutaneous TID WC  . insulin glargine  25 Units Subcutaneous QHS  . isosorbide mononitrate  30 mg Oral Daily  . metoprolol succinate  25 mg Oral BID  . multivitamin with minerals  1 tablet Oral Daily  . nutrition supplement (JUVEN)  1 packet Oral BID BM  . pantoprazole  40 mg Oral Daily      Madelon Lips,  MD 12/15/2019, 10:49 AM
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
## 2019-12-15 NOTE — Progress Notes (Signed)
PHARMACY NOTE:  ANTIMICROBIAL RENAL DOSAGE ADJUSTMENT  Current antimicrobial regimen includes a mismatch between antimicrobial dosage and estimated renal function.  As per policy approved by the Pharmacy & Therapeutics and Medical Executive Committees, the antimicrobial dosage will be adjusted accordingly.  Current antimicrobial dosage:  Ampicillin IV 2g q12 hours  Indication: Diabetic foot infection  Renal Function:  Estimated Creatinine Clearance: 35.6 mL/min (A) (by C-G formula based on SCr of 2.9 mg/dL (H)).    Antimicrobial dosage has been changed to:  Ampicillin IV 2g q8 hours  Additional comments: Scr has continued to decrease and renal function has improved to > 30 mL/min   Thank you for allowing pharmacy to be a part of this patient's care.  Sherren Kerns, PharmD PGY1 Acute Care Pharmacy Resident 12/15/2019 2:03 PM

## 2019-12-15 NOTE — Progress Notes (Signed)
Pt complained of shortness of breath, O2 saturations on RA 97 percent lung sounds diminished in all bases. Respirations 18-22, paged X [facility] NP who ordered xray. Those results   Called to X blunt 20 mg of lasix, q 6 hour prn dou nebs ordered and given. incentive spirometer given and instructed on how to use.

## 2019-12-16 LAB — CBC
HCT: 27.5 % — ABNORMAL LOW (ref 39.0–52.0)
Hemoglobin: 7.9 g/dL — ABNORMAL LOW (ref 13.0–17.0)
MCH: 25.2 pg — ABNORMAL LOW (ref 26.0–34.0)
MCHC: 28.7 g/dL — ABNORMAL LOW (ref 30.0–36.0)
MCV: 87.6 fL (ref 80.0–100.0)
Platelets: 221 10*3/uL (ref 150–400)
RBC: 3.14 MIL/uL — ABNORMAL LOW (ref 4.22–5.81)
RDW: 19.9 % — ABNORMAL HIGH (ref 11.5–15.5)
WBC: 8.6 10*3/uL (ref 4.0–10.5)
nRBC: 0 % (ref 0.0–0.2)

## 2019-12-16 LAB — RENAL FUNCTION PANEL
Albumin: 2.1 g/dL — ABNORMAL LOW (ref 3.5–5.0)
Anion gap: 10 (ref 5–15)
BUN: 115 mg/dL — ABNORMAL HIGH (ref 6–20)
CO2: 20 mmol/L — ABNORMAL LOW (ref 22–32)
Calcium: 7.9 mg/dL — ABNORMAL LOW (ref 8.9–10.3)
Chloride: 107 mmol/L (ref 98–111)
Creatinine, Ser: 2.77 mg/dL — ABNORMAL HIGH (ref 0.61–1.24)
GFR calc Af Amer: 30 mL/min — ABNORMAL LOW (ref >60)
GFR calc non Af Amer: 25 mL/min — ABNORMAL LOW (ref >60)
Glucose, Bld: 155 mg/dL — ABNORMAL HIGH (ref 70–99)
Phosphorus: 4.3 mg/dL (ref 2.5–4.6)
Potassium: 4.5 mmol/L (ref 3.5–5.1)
Sodium: 137 mmol/L (ref 135–145)

## 2019-12-16 LAB — GLUCOSE, CAPILLARY
Glucose-Capillary: 151 mg/dL — ABNORMAL HIGH (ref 70–99)
Glucose-Capillary: 104 mg/dL — ABNORMAL HIGH (ref 70–99)
Glucose-Capillary: 128 mg/dL — ABNORMAL HIGH (ref 70–99)
Glucose-Capillary: 222 mg/dL — ABNORMAL HIGH (ref 70–99)

## 2019-12-16 LAB — MAGNESIUM: Magnesium: 1.7 mg/dL (ref 1.7–2.4)

## 2019-12-16 LAB — PROTIME-INR
INR: 1.4 — ABNORMAL HIGH (ref 0.8–1.2)
Prothrombin Time: 16.7 seconds — ABNORMAL HIGH (ref 11.4–15.2)

## 2019-12-16 MED ORDER — PANTOPRAZOLE SODIUM 40 MG PO TBEC: 40.0000 mg | DELAYED_RELEASE_TABLET | Freq: Every day | ORAL | 0 refills | Status: AC

## 2019-12-16 MED ORDER — PREGABALIN 200 MG PO CAPS: 200.0000 mg | ORAL_CAPSULE | Freq: Every day | ORAL | Status: AC

## 2019-12-16 MED ORDER — DOCUSATE SODIUM 100 MG PO CAPS: 100.0000 mg | ORAL_CAPSULE | Freq: Every day | ORAL | 0 refills | Status: AC | PRN

## 2019-12-16 MED ORDER — ENSURE ENLIVE PO LIQD: 237.0000 mL | Freq: Two times a day (BID) | ORAL | 12 refills | Status: AC

## 2019-12-16 MED ORDER — FUROSEMIDE 80 MG PO TABS: 40.0000 mg | ORAL_TABLET | Freq: Two times a day (BID) | ORAL | 0 refills | Status: AC
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
## 2019-12-16 NOTE — Progress Notes (Signed)
[facility] Progress Note    Assessment/ Plan:   1. 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  Stable GFR at this time but sig azotemia  K OK. No strong reason for HD, UOP and downtrending SCr are encouraging. Azotemia likely related to intravascular vol depletion.  Improved with IVFs.  OK to go from renal perspective with close nephrology followup- his nephrologist is Dr. Sherral Hammers in [place].  Expect increased fluid and salt consumption on discharge, would send on lasix 40 BID (says home dose is 80 BID). 2. HTN/Vol; oas above.  BP stable 3. 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 Anemia: on ESA, got fereheme 12/26  Subjective:    Bun and CR even more improved.  OK for d/c today from renal perspective.    Objective:   BP 119/71 (BP Location: Right Arm)   Pulse 84   Temp 97.7 F (36.5 C) (Oral)   Resp 17   Ht 5\' 9"  (1.753 m)   Wt 101.6 kg   SpO2 97%   BMI 33.08 kg/m   Intake/Output Summary (Last 24 hours) at 12/16/2019 1212 Last data filed at 12/16/2019 [id number] Gross per 24 hour  Intake 870.87 ml  Output 1675 ml  Net -804.13 ml   Weight change:   Physical Exam: Gen: awake, in bed CVS: RRR Resp: clear Abd: obese Ext: no LE edema, L diabetic stump wound, dressed, s/p R BKA Neuro: AAO x 3, eccentric, no asterixis  Imaging: DG CHEST PORT 1 VIEW  Result Date: 12/15/2019 CLINICAL DATA:  Shortness of breath. EXAM: PORTABLE CHEST 1 VIEW COMPARISON:  Radiograph 12/06/2019 FINDINGS: Patient is rotated. Median sternotomy. Unchanged cardiomegaly. Low lung volumes persist. Vascular congestion. Patchy bibasilar opacities with possible left pleural effusion. No pneumothorax. No acute osseous abnormalities are seen. IMPRESSION: 1. Unchanged cardiomegaly.  Vascular congestion. 2. Low lung volumes with patchy bibasilar opacities, may be atelectasis or pneumonia. Possible left pleural effusion. Electronically Signed   By: Keith Rake M.D.   On: 12/15/2019 22:02     Labs: BMET Recent Labs  Lab 12/10/19 0300 12/11/19 0258 12/12/19 0315 12/13/19 0318 12/14/19 0416 12/14/19 0417 12/15/19 0222 12/16/19 0430  NA 134* 136 139 138 143  --  138 137  K 5.0 5.3* 4.8 4.7 4.7  --  4.4 4.5  CL 102 102 107 104 108  --  105 107  CO2 17* 20* 20* 20* 23  --  22 20*  GLUCOSE 299* 330* 154* 167* 119*  --  147* 155*  BUN 103* 125* 134* 137* 138*  --  126* 115*  CREATININE 4.55* 4.72* 4.19* 3.92* 3.42*  --  2.90* 2.77*  CALCIUM 7.5* 7.7* 7.7* 8.0* 7.9*  --  7.7* 7.9*  PHOS 6.4* 5.9* 5.0* 5.2*  --  4.7* 4.4 4.3   CBC Recent Labs  Lab 12/14/19 0416 12/15/19 0222 12/15/19 2306 12/16/19 0430  WBC 8.0 9.3 10.0 8.6  HGB 8.2* 8.2* 8.2* 7.9*  HCT 29.3* 28.3* 28.6* 27.5*  MCV 88.3 87.1 86.4 87.6  PLT 233 236 234 221    Medications:    . amLODipine  10 mg Oral Daily  . atorvastatin  10 mg Oral QHS  . clopidogrel  75 mg Oral Daily  . docusate sodium  100 mg Oral Daily  . feeding supplement (ENSURE ENLIVE)  237 mL Oral BID BM  . heparin injection (subcutaneous)  5,000 Units Subcutaneous Q8H  . insulin aspart  0-5 Units Subcutaneous QHS  . insulin aspart  0-9 Units  Subcutaneous TID WC  . insulin aspart  4 Units Subcutaneous TID WC  . insulin glargine  25 Units Subcutaneous QHS  . isosorbide mononitrate  30 mg Oral Daily  . metoprolol succinate  25 mg Oral BID  . multivitamin with minerals  1 tablet Oral Daily  . nutrition supplement (JUVEN)  1 packet Oral BID BM  . pantoprazole  40 mg Oral Daily      Madelon Lips, MD 12/16/2019, 12:12 PM

## 2019-12-16 NOTE — Progress Notes (Signed)
Pt called for pain medicine pain score of 4/10, brought patient 1 percocet  Pt stated that he wanted the morphine I explained to him that we had to try percocet first. Pt stated "come on man just give me the morphine" reeducated pt on medication use. I then left room and 5 mins later pt called and said "I cant breath" Lung sounds were still diminished. Oxygen saturation 98 % on room air. Instructed pt to use his IS pt declined.

## 2019-12-16 NOTE — Discharge Summary (Signed)
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 12/14/2019 FINAL  Final   Organism ID, Bacteria ENTEROCOCCUS FAECALIS  Final      Susceptibility   Enterococcus faecalis - MIC*    AMPICILLIN <=2 SENSITIVE Sensitive     VANCOMYCIN 1 SENSITIVE Sensitive     GENTAMICIN SYNERGY RESISTANT Resistant     * FEW ENTEROCOCCUS FAECALIS     Discharge Instructions:   Discharge Instructions    Diet Carb Modified   Complete by: As directed    Discharge instructions   Complete by: As directed    Follow-up with vascular surgery Dr. Carlis Abbott as outpatient in  2 weeks for staples removal and post surgical followup.  Follow-up with your primary care physician in 1 week.  Check CBC BMP at that time.  Follow-up with Dr. Sherral Hammers nephrologist at [facility] in 1 to 2 weeks.   Increase activity slowly   Complete by: As directed    As per physical therapy.     Allergies as of 12/16/2019   No Known Allergies     Medication List    STOP taking these medications   metolazone 5 MG tablet Commonly known as: ZAROXOLYN     TAKE these medications   amLODipine 10  MG tablet Commonly known as: NORVASC Take 10 mg by mouth daily.   amphetamine-dextroamphetamine 30 MG tablet Commonly known as: ADDERALL Take 30 mg by mouth 2 (two) times daily.   atorvastatin 10 MG tablet Commonly known as: LIPITOR Take 10 mg by mouth at bedtime.   butalbital-acetaminophen-caffeine 50-325-40 MG tablet Commonly known as: FIORICET Take 1 tablet by mouth daily as needed for headache.   clopidogrel 75 MG tablet Commonly known as: PLAVIX Take 75 mg by mouth daily.   docusate sodium 100 MG capsule Commonly known as: COLACE Take 1 capsule (100 mg total) by mouth daily as needed for mild constipation.   feeding supplement (ENSURE ENLIVE) Liqd Take 237 mLs by mouth 2 (two) times daily between meals.   furosemide 80 MG tablet Commonly known as: LASIX Take 0.5 tablets (40 mg total) by mouth 2 (two) times daily. What changed:   how much to take  when to take this   isosorbide mononitrate 30 MG 24 hr tablet Commonly known as: IMDUR Take 30 mg by mouth daily.   linagliptin 5 MG Tabs tablet Commonly known  as: TRADJENTA Take 5 mg by mouth daily.   metoprolol succinate 25 MG 24 hr tablet Commonly known as: TOPROL-XL Take 25 mg by mouth 2 (two) times daily.   NovoLOG FlexPen 100 UNIT/ML FlexPen Generic drug: insulin aspart Inject 30 Units into the skin 3 (three) times daily with meals.   oxyCODONE-acetaminophen 5-325 MG tablet Commonly known as: PERCOCET/ROXICET Take 1 tablet by mouth every 4 (four) hours as needed for moderate pain or severe pain.   pantoprazole 40 MG tablet Commonly known as: PROTONIX Take 1 tablet (40 mg total) by mouth daily.   pregabalin 200 MG capsule Commonly known as: LYRICA Take 1 capsule (200 mg total) by mouth daily. What changed: when to take this   QUEtiapine Fumarate 150 MG 24 hr tablet Commonly known as: SEROQUEL XR Take 150 mg by mouth at bedtime.      Follow-up Information    Marty Heck, MD. Schedule  an appointment as soon as possible for a visit.   Specialty: Vascular Surgery Why: post surgery follow up  Contact information: [place] [address] [phone]        Dr Sherral Hammers nephrology. Schedule an appointment as soon as possible for a visit in 1 week(s).   Why: follow up of kidney function           Time coordinating discharge: 39 minutes  Signed:  Shatoria Stooksbury  [facility] 12/16/2019, 1:10 PM
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
## 2019-12-16 NOTE — Progress Notes (Signed)
Pt sitting on side of bed demanding for someone to help him get ready to leave. Informed pt that transportation has not been arranged due to him living so far away. Educated pt on importance of not sitting on the side of the bed since he is at high risk for falling. Pt stated "I don't care if I fall. I will crawl to the door." Bed alarm is on with call light within reach.

## 2020-01-15 ENCOUNTER — Encounter: Payer: Self-pay | Admitting: Surgery

## 2020-01-15 NOTE — Progress Notes (Deleted)
  POST OPERATIVE OFFICE NOTE    CC:  F/u for surgery  HPI:  This is a 49 y.o. 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 Carlis Abbott.   There was purulence noted and cultures taken was positive for enterococcus sensitive to ampicillin.  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   He presents today for follow up.    No Known Allergies  Current Outpatient Medications  Medication Sig Dispense Refill  . amLODipine (NORVASC) 10 MG tablet Take 10 mg by mouth daily.    Marland Kitchen amphetamine-dextroamphetamine (ADDERALL) 30 MG tablet Take 30 mg by mouth 2 (two) times daily.    Marland Kitchen atorvastatin (LIPITOR) 10 MG tablet Take 10 mg by mouth at bedtime.    . butalbital-acetaminophen-caffeine (FIORICET) 50-325-40 MG tablet Take 1 tablet by mouth daily as needed for headache.    . clopidogrel (PLAVIX) 75 MG tablet Take 75 mg by mouth daily.    Marland Kitchen docusate sodium (COLACE) 100 MG capsule Take 1 capsule (100 mg total) by mouth daily as needed for mild constipation. 30 capsule 0  . feeding supplement, ENSURE ENLIVE, (ENSURE ENLIVE) LIQD Take 237 mLs by mouth 2 (two) times daily between meals. 237 mL 12  . furosemide (LASIX) 80 MG tablet Take 0.5 tablets (40 mg total) by mouth 2 (two) times daily. 60 tablet 0  . insulin aspart (NOVOLOG FLEXPEN) 100 UNIT/ML FlexPen Inject 30 Units into the skin 3 (three) times daily with meals.    . isosorbide mononitrate (IMDUR) 30 MG 24 hr tablet Take 30 mg by mouth daily.    Marland Kitchen linagliptin (TRADJENTA) 5 MG TABS tablet Take 5 mg by mouth daily.    . metoprolol succinate (TOPROL-XL) 25 MG 24 hr tablet Take 25 mg by mouth 2 (two) times daily.    Marland Kitchen oxyCODONE-acetaminophen (PERCOCET/ROXICET) 5-325 MG tablet Take 1 tablet by mouth every 4 (four) hours as needed for moderate pain or severe pain.    . pantoprazole (PROTONIX) 40 MG tablet Take 1 tablet (40 mg total) by mouth daily. 30 tablet 0  . pregabalin (LYRICA) 200 MG capsule Take 1 capsule (200 mg total)  by mouth daily.    . QUEtiapine Fumarate (SEROQUEL XR) 150 MG 24 hr tablet Take 150 mg by mouth at bedtime.     No current facility-administered medications for this visit.     ROS:  See HPI  Physical Exam:  ***  Incision:  *** Extremities:  *** Neuro: *** Abdomen:  ***  Assessment/Plan:  This is a 49 y.o. male who is s/p: Right below knee amputation on 12/09/2019 by Dr. Carlis Abbott  -***   Leontine Locket, PA-C [facility] [phone]  Clinic MD:  Carlis Abbott

## 2020-09-12 DEATH — deceased
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
